# Patient Record
Sex: Male | Born: 1960 | Race: Black or African American | Hispanic: No | Marital: Single | Smoking: Never smoker
Health system: Southern US, Community
[De-identification: ages and names within clinical notes are randomized; demographics above are authoritative.]

## PROBLEM LIST (undated history)

## (undated) DIAGNOSIS — I10 Essential (primary) hypertension: Secondary | ICD-10-CM

## (undated) DIAGNOSIS — Z789 Other specified health status: Secondary | ICD-10-CM

## (undated) DIAGNOSIS — E119 Type 2 diabetes mellitus without complications: Secondary | ICD-10-CM

## (undated) HISTORY — PX: KNEE ARTHROSCOPY: SHX127

---

## 2009-03-23 ENCOUNTER — Emergency Department (HOSPITAL_COMMUNITY): Admission: EM | Admit: 2009-03-23 | Discharge: 2009-03-23 | Payer: Self-pay | Admitting: Emergency Medicine

## 2009-06-27 ENCOUNTER — Emergency Department (HOSPITAL_COMMUNITY): Admission: EM | Admit: 2009-06-27 | Discharge: 2009-06-27 | Payer: Self-pay | Admitting: Family Medicine

## 2013-05-13 ENCOUNTER — Ambulatory Visit: Payer: BC Managed Care – PPO | Admitting: Physical Therapy

## 2013-05-14 ENCOUNTER — Ambulatory Visit: Payer: BC Managed Care – PPO | Attending: Internal Medicine | Admitting: Physical Therapy

## 2013-05-14 DIAGNOSIS — R609 Edema, unspecified: Secondary | ICD-10-CM | POA: Diagnosis not present

## 2013-05-14 DIAGNOSIS — IMO0001 Reserved for inherently not codable concepts without codable children: Secondary | ICD-10-CM | POA: Diagnosis not present

## 2013-05-14 DIAGNOSIS — M6281 Muscle weakness (generalized): Secondary | ICD-10-CM | POA: Insufficient documentation

## 2013-05-14 DIAGNOSIS — M2550 Pain in unspecified joint: Secondary | ICD-10-CM | POA: Diagnosis not present

## 2013-05-14 DIAGNOSIS — R293 Abnormal posture: Secondary | ICD-10-CM | POA: Diagnosis not present

## 2013-05-14 DIAGNOSIS — M25569 Pain in unspecified knee: Secondary | ICD-10-CM | POA: Insufficient documentation

## 2013-05-18 ENCOUNTER — Ambulatory Visit: Payer: BC Managed Care – PPO | Admitting: Physical Therapy

## 2013-05-18 DIAGNOSIS — IMO0001 Reserved for inherently not codable concepts without codable children: Secondary | ICD-10-CM | POA: Diagnosis not present

## 2013-05-25 ENCOUNTER — Ambulatory Visit: Payer: BC Managed Care – PPO | Attending: Internal Medicine | Admitting: Physical Therapy

## 2013-05-25 DIAGNOSIS — R609 Edema, unspecified: Secondary | ICD-10-CM | POA: Insufficient documentation

## 2013-05-25 DIAGNOSIS — M2550 Pain in unspecified joint: Secondary | ICD-10-CM | POA: Insufficient documentation

## 2013-05-25 DIAGNOSIS — M25569 Pain in unspecified knee: Secondary | ICD-10-CM | POA: Insufficient documentation

## 2013-05-25 DIAGNOSIS — R293 Abnormal posture: Secondary | ICD-10-CM | POA: Insufficient documentation

## 2013-05-25 DIAGNOSIS — M6281 Muscle weakness (generalized): Secondary | ICD-10-CM | POA: Insufficient documentation

## 2013-05-25 DIAGNOSIS — IMO0001 Reserved for inherently not codable concepts without codable children: Secondary | ICD-10-CM | POA: Insufficient documentation

## 2013-05-27 ENCOUNTER — Ambulatory Visit: Payer: BC Managed Care – PPO | Admitting: Physical Therapy

## 2013-06-01 ENCOUNTER — Ambulatory Visit: Payer: BC Managed Care – PPO | Admitting: Physical Therapy

## 2013-06-03 ENCOUNTER — Encounter: Payer: BC Managed Care – PPO | Admitting: Physical Therapy

## 2013-06-08 ENCOUNTER — Encounter: Payer: BC Managed Care – PPO | Admitting: Physical Therapy

## 2013-06-10 ENCOUNTER — Encounter: Payer: BC Managed Care – PPO | Admitting: Physical Therapy

## 2014-04-09 ENCOUNTER — Ambulatory Visit (HOSPITAL_COMMUNITY)
Admission: RE | Admit: 2014-04-09 | Discharge: 2014-04-09 | Disposition: A | Discharge status: Home / Self Care | Payer: 59 | Source: Ambulatory Visit | Attending: Specialist | Admitting: Specialist

## 2014-04-09 ENCOUNTER — Other Ambulatory Visit (HOSPITAL_COMMUNITY): Payer: Self-pay | Admitting: Specialist

## 2014-04-09 DIAGNOSIS — M79609 Pain in unspecified limb: Secondary | ICD-10-CM

## 2014-04-09 DIAGNOSIS — M7989 Other specified soft tissue disorders: Secondary | ICD-10-CM | POA: Insufficient documentation

## 2014-04-09 DIAGNOSIS — M79662 Pain in left lower leg: Secondary | ICD-10-CM | POA: Diagnosis present

## 2014-04-09 DIAGNOSIS — M25562 Pain in left knee: Secondary | ICD-10-CM

## 2014-04-09 NOTE — Progress Notes (Signed)
*  PRELIMINARY RESULTS* Vascular Ultrasound Left lower extremity venous duplex has been completed.  Preliminary findings: No evidence of DVT. There is an area of mixed echoes in the left proximal calf, suggestive of a muscle tear.  Called results to [place]. She will let Dr. Shelle IronBeane know. Patient will be contacted with further instructions.    Farrel DemarkJill Eunice, RDMS, RVT  04/09/2014, 6:38 PM

## 2014-06-14 ENCOUNTER — Ambulatory Visit: Payer: Self-pay | Admitting: Orthopedic Surgery

## 2014-06-24 ENCOUNTER — Encounter (HOSPITAL_COMMUNITY): Admission: RE | Payer: Self-pay | Source: Ambulatory Visit

## 2014-06-24 ENCOUNTER — Inpatient Hospital Stay (HOSPITAL_COMMUNITY): Admission: RE | Admit: 2014-06-24 | Payer: 59 | Source: Ambulatory Visit | Admitting: Specialist

## 2014-06-24 SURGERY — ARTHROPLASTY, KNEE, TOTAL
Anesthesia: General | Site: Knee | Laterality: Left

## 2015-03-14 ENCOUNTER — Ambulatory Visit: Payer: Self-pay | Admitting: Orthopedic Surgery

## 2015-03-29 ENCOUNTER — Ambulatory Visit: Payer: Self-pay | Admitting: Orthopedic Surgery

## 2015-03-29 NOTE — H&P (Signed)
Mcgwire Morrisette DOB: 1960/09/14 Single / Language: Other / Race: Native Hawaiian or Other Pacific Islander Male  H&P Date: 03/29/15  Chief Complaint: L knee pain  History of Present Illness The patient is a 55 year old male who comes in today for a preoperative History and Physical. The patient is scheduled for a left total knee arthroplasty to be performed by Dr. Javier Docker, MD at [facility] on 04/07/15. Kejuan reports ongoing pain L knee s/p arthroscopy with continued swelling, stiffness, and pain limiting activities and quality of life at this point. Symptoms are refractory to activity modifications, quad strengthening, bracing, medications, aspiration and injection, relative rest.  Dr. Shelle Iron and the patient mutually agreed to proceed with a Left total knee replacement. Risks and benefits of the procedure were discussed including stiffness, suboptimal range of motion, persistent pain, infection requiring removal of prosthesis and reinsertion, need for prophylactic antibiotics in the future, for example, dental procedures, possible need for manipulation, revision in the future and also anesthetic complications including DVT, PE, etc. We discussed the perioperative course, time in the hospital, postoperative recovery and the need for elevation to control swelling. We also discussed the predicted range of motion and the probability that squatting and kneeling would be unobtainable in the future. In addition, postoperative anticoagulation was discussed. We have obtained preoperative medical clearance as necessary- Dr. Mikeal Hawthorne (PCP). Provided illustrated handout and discussed it in detail. They will enroll in the total joint replacement educational forum at the hospital.  [facility] pre-op appt scheduled for 2/9  Problem List/Past Medical Hx S/P arthroscopy of knee (B14.782)  Primary osteoarthritis of left knee (M17.12)  Irritable bowel syndrome  Migraine Headache  Ulcer disease  High  blood pressure  Diabetes Mellitus, Type I  Hepatitis A   Allergies No Known Drug Allergies 12/22/2013  Family History No pertinent family history  First Degree Relatives  reported  Social History Tobacco use  Never smoker. 12/22/2013 Children  1 Current work status  working full time Not under pain contract  Number of flights of stairs before winded  4-5 Tobacco / smoke exposure  12/22/2013: yes outdoors only Exercise  Exercises never Never consumed alcohol  12/22/2013: Never consumed alcohol Living situation  live alone Marital status  single No history of drug/alcohol rehab   Medication History  Oxycodone-Acetaminophen (7.5-325MG  Tablet, Oral) Active. Atenolol (  Tablet, Oral) Active. GlipiZIDE (  Tablet, Oral) Active. Ibuprofen (  Tablet, 1 (one) Oral) Active. Metoclopramide HCl (  Tablet, Oral) Active. Zolpidem Tartrate (  Tablet, Oral) Active. MetFORMIN HCl (  Tablet, Oral) Active. Medications Reconciled  Past Surgical History  No pertinent past surgical history   Review of Systems General Not Present- Chills, Fatigue, Fever, Memory Loss, Night Sweats, Weight Gain and Weight Loss. Skin Not Present- Eczema, Hives, Itching, Lesions and Rash. HEENT Not Present- Dentures, Double Vision, Headache, Hearing Loss, Tinnitus and Visual Loss. Respiratory Not Present- Allergies, Chronic Cough, Coughing up blood, Shortness of breath at rest and Shortness of breath with exertion. Cardiovascular Not Present- Chest Pain, Difficulty Breathing Lying Down, Murmur, Palpitations, Racing/skipping heartbeats and Swelling. Gastrointestinal Not Present- Abdominal Pain, Bloody Stool, Constipation, Diarrhea, Difficulty Swallowing, Heartburn, Jaundice, Loss of appetitie, Nausea and Vomiting. Male Genitourinary Not Present- Blood in Urine, Discharge, Flank Pain, Incontinence, Painful Urination, Urgency, Urinary frequency, Urinary Retention, Urinating  at Night and Weak urinary stream. Musculoskeletal Present- Joint Pain, Joint Swelling and Morning Stiffness. Not Present- Back Pain, Muscle Pain, Muscle Weakness and Spasms. Neurological Not Present- Blackout spells, Difficulty with balance, Dizziness,  Paralysis, Tremor and Weakness. Psychiatric Not Present- Insomnia.  Physical Exam General Mental Status -Alert and cooperative. General Appearance-pleasant, Not in acute distress. Orientation-Oriented X3. Build & Nutrition-Well nourished and Well developed.  Head and Neck Head-normocephalic, atraumatic . Neck Global Assessment - supple, no bruit auscultated on the right, no bruit auscultated on the left.  Eye Pupil - Bilateral-Regular and Round. Motion - Bilateral-EOMI.  Chest and Lung Exam Auscultation Breath sounds - clear at anterior chest wall and clear at posterior chest wall. Adventitious sounds - No Adventitious sounds.  Cardiovascular Auscultation Rhythm - Regular rate and rhythm. Heart Sounds - S1 WNL and S2 WNL. Murmurs & Other Heart Sounds - Auscultation of the heart reveals - No Murmurs.  Abdomen Palpation/Percussion Tenderness - Abdomen is non-tender to palpation. Rigidity (guarding) - Abdomen is soft. Auscultation Auscultation of the abdomen reveals - Bowel sounds normal.  Male Genitourinary Not done, not pertinent to present illness  Musculoskeletal On exam, well nourished, well developed. He does have an antalgic gait. Left knee moderate effusion noted. Mild tenderness over the lateral joint line. No tenderness in medial joint line, patella, patellar tendon, quad tendon, fibular head, peroneal nerve, popliteal space. No sign of DVT. Range of motion approximately -5 to 110 degrees. He does have a slight valgus deformity. Mild patellofemoral pain with compression. No instability.  Assessment & Plan Primary osteoarthritis of left knee (M17.12)  Pt with end-stage knee DJD, bone-on-bone, refractory  to conservative tx, scheduled for total knee replacement by Dr. Shelle Iron. We again discussed the procedure itself as well as risks, complications and alternatives, including but not limited to DVT, PE, infx, bleeding, failure of procedure, need for secondary procedure including manipulation, nerve injury, ongoing pain/symptoms, anesthesia risk, even stroke or death. Also discussed typical post-op protocols, activity restrictions, need for PT, flexion/extension exercises, time out of work. Discussed need for DVT ppx post-op with Xarelto then ASA per protocol. Discussed dental ppx. Also discussed limitations post-operatively such as kneeling and squatting. All questions were answered. Patient desires to proceed with surgery as scheduled. Will hold supplements, ASA and NSAIDs accordingly, instruced to hold Ibuprofen. MRSA decolonization Rx given today. Will plan Kefzol pre-op unless he tests positive for MRSA or MSSA. Will remain NPO after MN night before surgery. Will present to [facility] for pre-op testing as scheduled 03/31/15. Plan Xarelto 2 weeks post-op for DVT ppx then ASA. Plan Percocet ES, Robaxin, Colace, Miralax. Plan rehab post-op prior to home with HHPT then outpatient PT. Will follow up 10-14 days post-op for staple removal and xrays.  Plan Left total knee arthroplasty  Signed electronically by Dorothy Spark, PA-C for Dr. Shelle Iron

## 2015-03-30 NOTE — Patient Instructions (Addendum)
Eric Collier  03/30/2015   Your procedure is scheduled on: 04-07-15  Report to [facility] Main  Entrance take [facility]  elevators to 3rd floor to  Short Stay Center at  0515 AM.  Call this number if you have problems the morning of surgery [phone]   Remember: ONLY 1 PERSON MAY GO WITH YOU TO SHORT STAY TO GET  READY MORNING OF YOUR SURGERY.  Do not eat food or drink liquids :After Midnight.     Take these medicines the morning of surgery with A SIP OF WATER: Atenolol. Do not take any oral Diabetic meds or insulin AM.. DO NOT TAKE ANY DIABETIC MEDICATIONS DAY OF YOUR SURGERY                               You may not have any metal on your body including hair pins and              piercings  Do not wear jewelry, make-up, lotions, powders or perfumes, deodorant             Do not wear nail polish.  Do not shave  48 hours prior to surgery.              Men may shave face and neck.   Do not bring valuables to the hospital. [facility] IS NOT             RESPONSIBLE   FOR VALUABLES.  Contacts, dentures or bridgework may not be worn into surgery.  Leave suitcase in the car. After surgery it may be brought to your room.     Patients discharged the day of surgery will not be allowed to drive home.  Name and phone number of your driver:Eric Collier, brother- [phone] cell  Special Instructions: N/A              Please read over the following fact sheets you were given: _____________________________________________________________________             [facility] - Preparing for Surgery Before surgery, you can play an important role.  Because skin is not sterile, your skin needs to be as free of germs as possible.  You can reduce the number of germs on your skin by washing with CHG (chlorahexidine gluconate) soap before surgery.  CHG is an antiseptic cleaner which kills germs and bonds with the skin to continue killing germs even after washing. Please DO NOT  use if you have an allergy to CHG or antibacterial soaps.  If your skin becomes reddened/irritated stop using the CHG and inform your nurse when you arrive at Short Stay. Do not shave (including legs and underarms) for at least 48 hours prior to the first CHG shower.  You may shave your face/neck. Please follow these instructions carefully:  1.  Shower with CHG Soap the night before surgery and the  morning of Surgery.  2.  If you choose to wash your hair, wash your hair first as usual with your  normal  shampoo.  3.  After you shampoo, rinse your hair and body thoroughly to remove the  shampoo.                           4.  Use CHG as you would any other liquid soap.  You can apply chg directly  to the skin and wash                       Gently with a scrungie or clean washcloth.  5.  Apply the CHG Soap to your body ONLY FROM THE NECK DOWN.   Do not use on face/ open                           Wound or open sores. Avoid contact with eyes, ears mouth and genitals (private parts).                       Wash face,  Genitals (private parts) with your normal soap.             6.  Wash thoroughly, paying special attention to the area where your surgery  will be performed.  7.  Thoroughly rinse your body with warm water from the neck down.  8.  DO NOT shower/wash with your normal soap after using and rinsing off  the CHG Soap.                9.  Pat yourself dry with a clean towel.            10.  Wear clean pajamas.            11.  Place clean sheets on your bed the night of your first shower and do not  sleep with pets. Day of Surgery : Do not apply any lotions/deodorants the morning of surgery.  Please wear clean clothes to the hospital/surgery center.  FAILURE TO FOLLOW THESE INSTRUCTIONS MAY RESULT IN THE CANCELLATION OF YOUR SURGERY PATIENT SIGNATURE_________________________________  NURSE  SIGNATURE__________________________________  ________________________________________________________________________   Adam Phenix  An incentive spirometer is a tool that can help keep your lungs clear and active. This tool measures how well you are filling your lungs with each breath. Taking long deep breaths may help reverse or decrease the chance of developing breathing (pulmonary) problems (especially infection) following:  A long period of time when you are unable to move or be active. BEFORE THE PROCEDURE   If the spirometer includes an indicator to show your best effort, your nurse or respiratory therapist will set it to a desired goal.  If possible, sit up straight or lean slightly forward. Try not to slouch.  Hold the incentive spirometer in an upright position. INSTRUCTIONS FOR USE  1. Sit on the edge of your bed if possible, or sit up as far as you can in bed or on a chair. 2. Hold the incentive spirometer in an upright position. 3. Breathe out normally. 4. Place the mouthpiece in your mouth and seal your lips tightly around it. 5. Breathe in slowly and as deeply as possible, raising the piston or the ball toward the top of the column. 6. Hold your breath for 3-5 seconds or for as long as possible. Allow the piston or ball to fall to the bottom of the column. 7. Remove the mouthpiece from your mouth and breathe out normally. 8. Rest for a few seconds and repeat Steps 1 through 7 at least 10 times every 1-2 hours when you are awake. Take your time and take a few normal breaths between deep breaths. 9. The spirometer may include an indicator to show your best effort. Use the indicator as a goal to work toward  during each repetition. 10. After each set of 10 deep breaths, practice coughing to be sure your lungs are clear. If you have an incision (the cut made at the time of surgery), support your incision when coughing by placing a pillow or rolled up towels firmly  against it. Once you are able to get out of bed, walk around indoors and cough well. You may stop using the incentive spirometer when instructed by your caregiver.  RISKS AND COMPLICATIONS  Take your time so you do not get dizzy or light-headed.  If you are in pain, you may need to take or ask for pain medication before doing incentive spirometry. It is harder to take a deep breath if you are having pain. AFTER USE  Rest and breathe slowly and easily.  It can be helpful to keep track of a log of your progress. Your caregiver can provide you with a simple table to help with this. If you are using the spirometer at home, follow these instructions: [place] IF:   You are having difficultly using the spirometer.  You have trouble using the spirometer as often as instructed.  Your pain medication is not giving enough relief while using the spirometer.  You develop fever of 100.5 F (38.1 C) or higher. SEEK IMMEDIATE MEDICAL CARE IF:   You cough up bloody sputum that had not been present before.  You develop fever of 102 F (38.9 C) or greater.  You develop worsening pain at or near the incision site. MAKE SURE YOU:   Understand these instructions.  Will watch your condition.  Will get help right away if you are not doing well or get worse. Document Released: 06/18/2006 Document Revised: 04/30/2011 Document Reviewed: 08/19/2006 [facility] Patient Information 2014 [place], [place].   ________________________________________________________________________

## 2015-03-31 ENCOUNTER — Encounter (HOSPITAL_COMMUNITY)
Admission: RE | Admit: 2015-03-31 | Discharge: 2015-03-31 | Disposition: A | Discharge status: Home / Self Care | Payer: Managed Care, Other (non HMO) | Source: Ambulatory Visit | Attending: Specialist | Admitting: Specialist

## 2015-03-31 ENCOUNTER — Ambulatory Visit (HOSPITAL_COMMUNITY)
Admission: RE | Admit: 2015-03-31 | Discharge: 2015-03-31 | Disposition: A | Discharge status: Home / Self Care | Payer: Managed Care, Other (non HMO) | Source: Ambulatory Visit | Attending: Orthopedic Surgery | Admitting: Orthopedic Surgery

## 2015-03-31 ENCOUNTER — Encounter (HOSPITAL_COMMUNITY): Payer: Self-pay

## 2015-03-31 DIAGNOSIS — I10 Essential (primary) hypertension: Secondary | ICD-10-CM | POA: Diagnosis not present

## 2015-03-31 DIAGNOSIS — Z01818 Encounter for other preprocedural examination: Secondary | ICD-10-CM | POA: Insufficient documentation

## 2015-03-31 DIAGNOSIS — E119 Type 2 diabetes mellitus without complications: Secondary | ICD-10-CM | POA: Diagnosis not present

## 2015-03-31 DIAGNOSIS — M1712 Unilateral primary osteoarthritis, left knee: Secondary | ICD-10-CM

## 2015-03-31 HISTORY — DX: Other specified health status: Z78.9

## 2015-03-31 HISTORY — DX: Essential (primary) hypertension: I10

## 2015-03-31 HISTORY — DX: Type 2 diabetes mellitus without complications: E11.9

## 2015-03-31 LAB — SURGICAL PCR SCREEN
MRSA, PCR: NEGATIVE
STAPHYLOCOCCUS AUREUS: NEGATIVE

## 2015-03-31 LAB — BASIC METABOLIC PANEL
ANION GAP: 9 (ref 5–15)
BUN: 17 mg/dL (ref 6–20)
CALCIUM: 9.4 mg/dL (ref 8.9–10.3)
CO2: 27 mmol/L (ref 22–32)
Chloride: 102 mmol/L (ref 101–111)
Creatinine, Ser: 1.07 mg/dL (ref 0.61–1.24)
GFR calc Af Amer: >60 mL/min (ref >60)
GLUCOSE: 150 mg/dL — AB (ref 65–99)
POTASSIUM: 4.9 mmol/L (ref 3.5–5.1)
SODIUM: 138 mmol/L (ref 135–145)

## 2015-03-31 LAB — PROTIME-INR
INR: 1.1 (ref 0.00–1.49)
PROTHROMBIN TIME: 14 s (ref 11.6–15.2)

## 2015-03-31 LAB — URINALYSIS, ROUTINE W REFLEX MICROSCOPIC
Bilirubin Urine: NEGATIVE
GLUCOSE, UA: NEGATIVE mg/dL
HGB URINE DIPSTICK: NEGATIVE
Ketones, ur: NEGATIVE mg/dL
LEUKOCYTES UA: NEGATIVE
Nitrite: NEGATIVE
PH: 5.5 (ref 5.0–8.0)
PROTEIN: NEGATIVE mg/dL
SPECIFIC GRAVITY, URINE: 1.023 (ref 1.005–1.030)

## 2015-03-31 LAB — CBC
HEMATOCRIT: 43.7 % (ref 39.0–52.0)
Hemoglobin: 13.8 g/dL (ref 13.0–17.0)
MCH: 27.5 pg (ref 26.0–34.0)
MCHC: 31.6 g/dL (ref 30.0–36.0)
MCV: 87.1 fL (ref 78.0–100.0)
PLATELETS: 232 10*3/uL (ref 150–400)
RBC: 5.02 MIL/uL (ref 4.22–5.81)
RDW: 13.6 % (ref 11.5–15.5)
WBC: 4.4 10*3/uL (ref 4.0–10.5)

## 2015-03-31 LAB — APTT: APTT: 31 s (ref 24–37)

## 2015-03-31 LAB — ABO/RH: ABO/RH(D): O POS

## 2015-03-31 NOTE — Pre-Procedure Instructions (Addendum)
EKG done today. Pt states Dr. Shelle Iron office gave him ointment and soap wash to use now throughout until surgery.

## 2015-04-01 LAB — HEMOGLOBIN A1C
HEMOGLOBIN A1C: 7.4 % — AB (ref 4.8–5.6)
MEAN PLASMA GLUCOSE: 166 mg/dL

## 2015-04-07 ENCOUNTER — Encounter (HOSPITAL_COMMUNITY): Payer: Self-pay | Admitting: *Deleted

## 2015-04-07 ENCOUNTER — Encounter (HOSPITAL_COMMUNITY)
Admission: RE | Disposition: A | Discharge status: Skilled Nursing Facility | Payer: Self-pay | Source: Ambulatory Visit | Attending: Specialist

## 2015-04-07 ENCOUNTER — Inpatient Hospital Stay (HOSPITAL_COMMUNITY)
Admission: RE | Admit: 2015-04-07 | Discharge: 2015-04-09 | DRG: 470 | Disposition: A | Discharge status: Skilled Nursing Facility | Payer: 59 | Source: Ambulatory Visit | Attending: Specialist | Admitting: Specialist

## 2015-04-07 ENCOUNTER — Inpatient Hospital Stay (HOSPITAL_COMMUNITY): Payer: 59

## 2015-04-07 ENCOUNTER — Inpatient Hospital Stay (HOSPITAL_COMMUNITY): Payer: 59 | Admitting: Anesthesiology

## 2015-04-07 DIAGNOSIS — J9811 Atelectasis: Secondary | ICD-10-CM | POA: Diagnosis not present

## 2015-04-07 DIAGNOSIS — E119 Type 2 diabetes mellitus without complications: Secondary | ICD-10-CM | POA: Diagnosis present

## 2015-04-07 DIAGNOSIS — M25562 Pain in left knee: Secondary | ICD-10-CM | POA: Diagnosis present

## 2015-04-07 DIAGNOSIS — Z7984 Long term (current) use of oral hypoglycemic drugs: Secondary | ICD-10-CM | POA: Diagnosis not present

## 2015-04-07 DIAGNOSIS — I10 Essential (primary) hypertension: Secondary | ICD-10-CM | POA: Diagnosis present

## 2015-04-07 DIAGNOSIS — Z96652 Presence of left artificial knee joint: Secondary | ICD-10-CM

## 2015-04-07 DIAGNOSIS — Z79899 Other long term (current) drug therapy: Secondary | ICD-10-CM

## 2015-04-07 DIAGNOSIS — M1712 Unilateral primary osteoarthritis, left knee: Secondary | ICD-10-CM | POA: Diagnosis present

## 2015-04-07 DIAGNOSIS — Z96659 Presence of unspecified artificial knee joint: Secondary | ICD-10-CM

## 2015-04-07 HISTORY — PX: TOTAL KNEE ARTHROPLASTY: SHX125

## 2015-04-07 LAB — GLUCOSE, CAPILLARY
GLUCOSE-CAPILLARY: 195 mg/dL — AB (ref 65–99)
GLUCOSE-CAPILLARY: 206 mg/dL — AB (ref 65–99)
Glucose-Capillary: 136 mg/dL — ABNORMAL HIGH (ref 65–99)
Glucose-Capillary: 163 mg/dL — ABNORMAL HIGH (ref 65–99)
Glucose-Capillary: 145 mg/dL — ABNORMAL HIGH (ref 65–99)

## 2015-04-07 LAB — TYPE AND SCREEN
ABO/RH(D): O POS
Antibody Screen: NEGATIVE

## 2015-04-07 SURGERY — ARTHROPLASTY, KNEE, TOTAL
Anesthesia: General | Site: Knee | Laterality: Left

## 2015-04-07 MED ORDER — CEFAZOLIN SODIUM-DEXTROSE 2-3 GM-% IV SOLR
INTRAVENOUS | Status: AC
  Filled 2015-04-07: qty 50

## 2015-04-07 MED ORDER — FENTANYL CITRATE (PF) 250 MCG/5ML IJ SOLN
INTRAMUSCULAR | Status: AC
  Filled 2015-04-07: qty 5

## 2015-04-07 MED ORDER — ONDANSETRON HCL 4 MG/2ML IJ SOLN
INTRAMUSCULAR | Status: DC | PRN
  Administered 2015-04-07: 4 mg via INTRAVENOUS

## 2015-04-07 MED ORDER — FENTANYL CITRATE (PF) 100 MCG/2ML IJ SOLN
INTRAMUSCULAR | Status: DC | PRN
  Administered 2015-04-07 (×2): 100 ug via INTRAVENOUS
  Administered 2015-04-07: 50 ug via INTRAVENOUS
  Administered 2015-04-07 (×2): 100 ug via INTRAVENOUS

## 2015-04-07 MED ORDER — SENNOSIDES-DOCUSATE SODIUM 8.6-50 MG PO TABS: 1.0000 | ORAL_TABLET | Freq: Every evening | ORAL | Status: DC | PRN

## 2015-04-07 MED ORDER — LABETALOL HCL 5 MG/ML IV SOLN
INTRAVENOUS | Status: DC | PRN
  Administered 2015-04-07 (×4): 5 mg via INTRAVENOUS

## 2015-04-07 MED ORDER — PROPOFOL 10 MG/ML IV BOLUS
INTRAVENOUS | Status: DC | PRN
  Administered 2015-04-07: 200 mg via INTRAVENOUS

## 2015-04-07 MED ORDER — ACETAMINOPHEN 325 MG PO TABS
650.0000 mg | ORAL_TABLET | Freq: Four times a day (QID) | ORAL | Status: DC | PRN
  Administered 2015-04-08 – 2015-04-09 (×3): 650 mg via ORAL
  Filled 2015-04-07 (×3): qty 2

## 2015-04-07 MED ORDER — BISACODYL 5 MG PO TBEC: 5.0000 mg | DELAYED_RELEASE_TABLET | Freq: Every day | ORAL | Status: DC | PRN

## 2015-04-07 MED ORDER — DIPHENHYDRAMINE HCL 12.5 MG/5ML PO ELIX: 12.5000 mg | ORAL_SOLUTION | ORAL | Status: DC | PRN

## 2015-04-07 MED ORDER — HYDROMORPHONE HCL 2 MG/ML IJ SOLN
INTRAMUSCULAR | Status: AC
  Filled 2015-04-07: qty 1

## 2015-04-07 MED ORDER — METOCLOPRAMIDE HCL 5 MG/ML IJ SOLN: 5.0000 mg | Freq: Three times a day (TID) | INTRAMUSCULAR | Status: DC | PRN

## 2015-04-07 MED ORDER — HYDROMORPHONE HCL 1 MG/ML IJ SOLN
INTRAMUSCULAR | Status: AC
  Filled 2015-04-07: qty 1

## 2015-04-07 MED ORDER — POLYETHYLENE GLYCOL 3350 17 G PO PACK: 17.0000 g | PACK | Freq: Every day | ORAL | Status: AC

## 2015-04-07 MED ORDER — POLYMYXIN B SULFATE 500000 UNITS IJ SOLR
INTRAMUSCULAR | Status: DC | PRN
  Administered 2015-04-07: 500 mL

## 2015-04-07 MED ORDER — ACETAMINOPHEN 650 MG RE SUPP: 650.0000 mg | Freq: Four times a day (QID) | RECTAL | Status: DC | PRN

## 2015-04-07 MED ORDER — ROCURONIUM BROMIDE 100 MG/10ML IV SOLN
INTRAVENOUS | Status: AC
  Filled 2015-04-07: qty 1

## 2015-04-07 MED ORDER — TRANEXAMIC ACID 1000 MG/10ML IV SOLN
1000.0000 mg | INTRAVENOUS | Status: AC
  Administered 2015-04-07: 1000 mg via INTRAVENOUS
  Filled 2015-04-07: qty 10

## 2015-04-07 MED ORDER — DEXAMETHASONE SODIUM PHOSPHATE 10 MG/ML IJ SOLN
INTRAMUSCULAR | Status: AC
  Filled 2015-04-07: qty 1

## 2015-04-07 MED ORDER — MIDAZOLAM HCL 5 MG/5ML IJ SOLN
INTRAMUSCULAR | Status: DC | PRN
  Administered 2015-04-07: 2 mg via INTRAVENOUS

## 2015-04-07 MED ORDER — MAGNESIUM CITRATE PO SOLN: 1.0000 | Freq: Once | ORAL | Status: DC | PRN

## 2015-04-07 MED ORDER — FENTANYL CITRATE (PF) 100 MCG/2ML IJ SOLN
INTRAMUSCULAR | Status: AC
  Filled 2015-04-07: qty 2

## 2015-04-07 MED ORDER — ONDANSETRON HCL 4 MG PO TABS
4.0000 mg | ORAL_TABLET | Freq: Four times a day (QID) | ORAL | Status: DC | PRN
  Administered 2015-04-08: 4 mg via ORAL
  Filled 2015-04-07: qty 1

## 2015-04-07 MED ORDER — SUGAMMADEX SODIUM 200 MG/2ML IV SOLN
INTRAVENOUS | Status: DC | PRN
  Administered 2015-04-07: 200 mg via INTRAVENOUS

## 2015-04-07 MED ORDER — HYDROMORPHONE HCL 1 MG/ML IJ SOLN
1.0000 mg | INTRAMUSCULAR | Status: DC | PRN
  Administered 2015-04-08 (×4): 1 mg via INTRAVENOUS
  Filled 2015-04-07 (×4): qty 1

## 2015-04-07 MED ORDER — DOCUSATE SODIUM 100 MG PO CAPS
100.0000 mg | ORAL_CAPSULE | Freq: Two times a day (BID) | ORAL | Status: DC
  Administered 2015-04-07 – 2015-04-09 (×4): 100 mg via ORAL

## 2015-04-07 MED ORDER — HYDROMORPHONE HCL 1 MG/ML IJ SOLN
INTRAMUSCULAR | Status: DC | PRN
  Administered 2015-04-07 (×2): 1 mg via INTRAVENOUS

## 2015-04-07 MED ORDER — ATENOLOL 25 MG PO TABS
25.0000 mg | ORAL_TABLET | Freq: Every day | ORAL | Status: DC
  Administered 2015-04-07 – 2015-04-09 (×3): 25 mg via ORAL
  Filled 2015-04-07 (×3): qty 1

## 2015-04-07 MED ORDER — RIVAROXABAN 10 MG PO TABS: 10.0000 mg | ORAL_TABLET | Freq: Every day | ORAL | Status: DC

## 2015-04-07 MED ORDER — SUGAMMADEX SODIUM 200 MG/2ML IV SOLN
INTRAVENOUS | Status: AC
  Filled 2015-04-07: qty 2

## 2015-04-07 MED ORDER — SODIUM CHLORIDE 0.9 % IR SOLN
Status: DC | PRN
  Administered 2015-04-07 (×2): 1000 mL

## 2015-04-07 MED ORDER — CEFAZOLIN SODIUM-DEXTROSE 2-3 GM-% IV SOLR
2.0000 g | Freq: Four times a day (QID) | INTRAVENOUS | Status: AC
  Administered 2015-04-07 – 2015-04-08 (×3): 2 g via INTRAVENOUS
  Filled 2015-04-07 (×3): qty 50

## 2015-04-07 MED ORDER — PROPOFOL 10 MG/ML IV BOLUS
INTRAVENOUS | Status: AC
  Filled 2015-04-07: qty 20

## 2015-04-07 MED ORDER — PHENOL 1.4 % MT LIQD
1.0000 | OROMUCOSAL | Status: DC | PRN
  Filled 2015-04-07: qty 177

## 2015-04-07 MED ORDER — LIDOCAINE HCL (CARDIAC) 20 MG/ML IV SOLN
INTRAVENOUS | Status: AC
  Filled 2015-04-07: qty 5

## 2015-04-07 MED ORDER — HYDRALAZINE HCL 20 MG/ML IJ SOLN
INTRAMUSCULAR | Status: DC | PRN
  Administered 2015-04-07: 5 mg via INTRAVENOUS

## 2015-04-07 MED ORDER — METOCLOPRAMIDE HCL 10 MG PO TABS: 5.0000 mg | ORAL_TABLET | Freq: Three times a day (TID) | ORAL | Status: DC | PRN

## 2015-04-07 MED ORDER — INSULIN ASPART 100 UNIT/ML ~~LOC~~ SOLN
0.0000 [IU] | Freq: Three times a day (TID) | SUBCUTANEOUS | Status: DC
  Administered 2015-04-07: 5 [IU] via SUBCUTANEOUS
  Administered 2015-04-07: 3 [IU] via SUBCUTANEOUS
  Administered 2015-04-08: 2 [IU] via SUBCUTANEOUS
  Administered 2015-04-09: 8 [IU] via SUBCUTANEOUS
  Administered 2015-04-09: 5 [IU] via SUBCUTANEOUS

## 2015-04-07 MED ORDER — RISAQUAD PO CAPS
1.0000 | ORAL_CAPSULE | Freq: Every day | ORAL | Status: DC
  Administered 2015-04-07 – 2015-04-09 (×3): 1 via ORAL
  Filled 2015-04-07 (×3): qty 1

## 2015-04-07 MED ORDER — ONDANSETRON HCL 4 MG/2ML IJ SOLN
INTRAMUSCULAR | Status: AC
  Filled 2015-04-07: qty 2

## 2015-04-07 MED ORDER — MENTHOL 3 MG MT LOZG: 1.0000 | LOZENGE | OROMUCOSAL | Status: DC | PRN

## 2015-04-07 MED ORDER — BUPIVACAINE-EPINEPHRINE 0.25% -1:200000 IJ SOLN
INTRAMUSCULAR | Status: DC | PRN
  Administered 2015-04-07: 60 mL

## 2015-04-07 MED ORDER — ONDANSETRON HCL 4 MG/2ML IJ SOLN: 4.0000 mg | Freq: Four times a day (QID) | INTRAMUSCULAR | Status: DC | PRN

## 2015-04-07 MED ORDER — HYDROMORPHONE HCL 1 MG/ML IJ SOLN
0.2500 mg | INTRAMUSCULAR | Status: DC | PRN
  Administered 2015-04-07 (×3): 0.5 mg via INTRAVENOUS

## 2015-04-07 MED ORDER — DEXAMETHASONE SODIUM PHOSPHATE 10 MG/ML IJ SOLN
INTRAMUSCULAR | Status: DC | PRN
  Administered 2015-04-07: 10 mg via INTRAVENOUS

## 2015-04-07 MED ORDER — OXYCODONE HCL 5 MG PO TABS: 5.0000 mg | ORAL_TABLET | Freq: Once | ORAL | Status: DC | PRN

## 2015-04-07 MED ORDER — MIDAZOLAM HCL 2 MG/2ML IJ SOLN
INTRAMUSCULAR | Status: AC
  Filled 2015-04-07: qty 2

## 2015-04-07 MED ORDER — SODIUM CHLORIDE 0.9 % IR SOLN
Status: AC
  Filled 2015-04-07: qty 1

## 2015-04-07 MED ORDER — ROCURONIUM BROMIDE 100 MG/10ML IV SOLN
INTRAVENOUS | Status: DC | PRN
  Administered 2015-04-07 (×5): 10 mg via INTRAVENOUS
  Administered 2015-04-07: 40 mg via INTRAVENOUS

## 2015-04-07 MED ORDER — DEXTROSE 5 % IV SOLN
500.0000 mg | Freq: Four times a day (QID) | INTRAVENOUS | Status: DC | PRN
  Administered 2015-04-07: 500 mg via INTRAVENOUS
  Filled 2015-04-07 (×2): qty 5

## 2015-04-07 MED ORDER — CEFAZOLIN SODIUM-DEXTROSE 2-3 GM-% IV SOLR
2.0000 g | INTRAVENOUS | Status: AC
  Administered 2015-04-07: 2 g via INTRAVENOUS

## 2015-04-07 MED ORDER — SUCCINYLCHOLINE CHLORIDE 20 MG/ML IJ SOLN
INTRAMUSCULAR | Status: DC | PRN
  Administered 2015-04-07: 100 mg via INTRAVENOUS

## 2015-04-07 MED ORDER — HYDRALAZINE HCL 20 MG/ML IJ SOLN
INTRAMUSCULAR | Status: AC
  Filled 2015-04-07: qty 1

## 2015-04-07 MED ORDER — OXYCODONE HCL 5 MG/5ML PO SOLN
5.0000 mg | Freq: Once | ORAL | Status: DC | PRN
  Filled 2015-04-07: qty 5

## 2015-04-07 MED ORDER — ALUM & MAG HYDROXIDE-SIMETH 200-200-20 MG/5ML PO SUSP
30.0000 mL | ORAL | Status: DC | PRN
  Administered 2015-04-08: 30 mL via ORAL
  Filled 2015-04-07: qty 30

## 2015-04-07 MED ORDER — DOCUSATE SODIUM 100 MG PO CAPS: 100.0000 mg | ORAL_CAPSULE | Freq: Two times a day (BID) | ORAL | Status: AC | PRN

## 2015-04-07 MED ORDER — METHOCARBAMOL 500 MG PO TABS
500.0000 mg | ORAL_TABLET | Freq: Four times a day (QID) | ORAL | Status: DC | PRN
  Administered 2015-04-08 – 2015-04-09 (×3): 500 mg via ORAL
  Filled 2015-04-07 (×3): qty 1

## 2015-04-07 MED ORDER — OXYCODONE-ACETAMINOPHEN 5-325 MG PO TABS: 1.0000 | ORAL_TABLET | ORAL | Status: AC | PRN

## 2015-04-07 MED ORDER — OXYCODONE HCL 5 MG PO TABS
5.0000 mg | ORAL_TABLET | ORAL | Status: DC | PRN
  Administered 2015-04-07 (×2): 5 mg via ORAL
  Administered 2015-04-08: 10 mg via ORAL
  Administered 2015-04-08: 5 mg via ORAL
  Administered 2015-04-08 – 2015-04-09 (×5): 10 mg via ORAL
  Filled 2015-04-07 (×4): qty 1
  Filled 2015-04-07 (×6): qty 2

## 2015-04-07 MED ORDER — LABETALOL HCL 5 MG/ML IV SOLN
INTRAVENOUS | Status: AC
  Filled 2015-04-07: qty 4

## 2015-04-07 MED ORDER — LIDOCAINE HCL (CARDIAC) 20 MG/ML IV SOLN
INTRAVENOUS | Status: DC | PRN
  Administered 2015-04-07: 100 mg via INTRAVENOUS

## 2015-04-07 MED ORDER — RIVAROXABAN 10 MG PO TABS
10.0000 mg | ORAL_TABLET | Freq: Every day | ORAL | Status: DC
  Administered 2015-04-08: 10 mg via ORAL
  Filled 2015-04-07 (×2): qty 1

## 2015-04-07 MED ORDER — POTASSIUM CHLORIDE IN NACL 20-0.9 MEQ/L-% IV SOLN
INTRAVENOUS | Status: AC
  Administered 2015-04-07: 14:00:00 via INTRAVENOUS
  Filled 2015-04-07 (×2): qty 1000

## 2015-04-07 MED ORDER — BUPIVACAINE-EPINEPHRINE (PF) 0.25% -1:200000 IJ SOLN
INTRAMUSCULAR | Status: AC
  Filled 2015-04-07: qty 60

## 2015-04-07 MED ORDER — LACTATED RINGERS IV SOLN
INTRAVENOUS | Status: DC
  Administered 2015-04-07 (×3): via INTRAVENOUS

## 2015-04-07 MED ORDER — METHOCARBAMOL 500 MG PO TABS: 500.0000 mg | ORAL_TABLET | Freq: Three times a day (TID) | ORAL | Status: AC | PRN

## 2015-04-07 SURGICAL SUPPLY — 60 items
BAG ZIPLOCK 12X15 (MISCELLANEOUS) IMPLANT
BANDAGE ACE 4X5 VEL STRL LF (GAUZE/BANDAGES/DRESSINGS) ×3 IMPLANT
BANDAGE ACE 6X5 VEL STRL LF (GAUZE/BANDAGES/DRESSINGS) ×3 IMPLANT
BANDAGE ELASTIC 4 VELCRO ST LF (GAUZE/BANDAGES/DRESSINGS) ×3 IMPLANT
BANDAGE ELASTIC 6 VELCRO ST LF (GAUZE/BANDAGES/DRESSINGS) ×3 IMPLANT
BLADE SAG 18X100X1.27 (BLADE) ×3 IMPLANT
BLADE SAW SGTL 13.0X1.19X90.0M (BLADE) ×3 IMPLANT
CAPT KNEE TOTAL 3 ATTUNE ×3 IMPLANT
CEMENT HV SMART SET (Cement) ×6 IMPLANT
CLOSURE WOUND 1/2 X4 (GAUZE/BANDAGES/DRESSINGS)
CLOTH 2% CHLOROHEXIDINE 3PK (PERSONAL CARE ITEMS) ×3 IMPLANT
CUFF TOURN SGL QUICK 34 (TOURNIQUET CUFF) ×2
CUFF TRNQT CYL 34X4X40X1 (TOURNIQUET CUFF) ×1 IMPLANT
DECANTER SPIKE VIAL GLASS SM (MISCELLANEOUS) IMPLANT
DRAPE INCISE IOBAN 66X45 STRL (DRAPES) IMPLANT
DRAPE LG THREE QUARTER DISP (DRAPES) ×6 IMPLANT
DRAPE ORTHO SPLIT 77X108 STRL (DRAPES) ×4
DRAPE SURG ORHT 6 SPLT 77X108 (DRAPES) ×2 IMPLANT
DRAPE U-SHAPE 47X51 STRL (DRAPES) ×3 IMPLANT
DRSG AQUACEL AG ADV 3.5X10 (GAUZE/BANDAGES/DRESSINGS) ×3 IMPLANT
DRSG TEGADERM 4X4.75 (GAUZE/BANDAGES/DRESSINGS) IMPLANT
DURAPREP 26ML APPLICATOR (WOUND CARE) ×3 IMPLANT
ELECT REM PT RETURN 9FT ADLT (ELECTROSURGICAL) ×3
ELECTRODE REM PT RTRN 9FT ADLT (ELECTROSURGICAL) ×1 IMPLANT
EVACUATOR 1/8 PVC DRAIN (DRAIN) IMPLANT
GAUZE SPONGE 2X2 8PLY STRL LF (GAUZE/BANDAGES/DRESSINGS) IMPLANT
GLOVE BIOGEL PI IND STRL 7.0 (GLOVE) ×1 IMPLANT
GLOVE BIOGEL PI IND STRL 8 (GLOVE) ×1 IMPLANT
GLOVE BIOGEL PI INDICATOR 7.0 (GLOVE) ×2
GLOVE BIOGEL PI INDICATOR 8 (GLOVE) ×2
GLOVE SURG SS PI 7.0 STRL IVOR (GLOVE) ×3 IMPLANT
GLOVE SURG SS PI 7.5 STRL IVOR (GLOVE) IMPLANT
GLOVE SURG SS PI 8.0 STRL IVOR (GLOVE) ×3 IMPLANT
GOWN STRL REUS W/TWL XL LVL3 (GOWN DISPOSABLE) ×6 IMPLANT
HANDPIECE INTERPULSE COAX TIP (DISPOSABLE) ×2
IMMOBILIZER KNEE 20 (SOFTGOODS) ×6 IMPLANT
IMMOBILIZER KNEE 20 THIGH 36 (SOFTGOODS) ×1 IMPLANT
MANIFOLD NEPTUNE II (INSTRUMENTS) ×3 IMPLANT
NS IRRIG 1000ML POUR BTL (IV SOLUTION) IMPLANT
PACK TOTAL KNEE CUSTOM (KITS) ×3 IMPLANT
POSITIONER SURGICAL ARM (MISCELLANEOUS) ×3 IMPLANT
SET HNDPC FAN SPRY TIP SCT (DISPOSABLE) ×1 IMPLANT
SPONGE GAUZE 2X2 STER 10/PKG (GAUZE/BANDAGES/DRESSINGS)
SPONGE SURGIFOAM ABS GEL 100 (HEMOSTASIS) IMPLANT
STAPLER VISISTAT (STAPLE) IMPLANT
STRIP CLOSURE SKIN 1/2X4 (GAUZE/BANDAGES/DRESSINGS) IMPLANT
SUT BONE WAX W31G (SUTURE) ×3 IMPLANT
SUT MNCRL AB 4-0 PS2 18 (SUTURE) IMPLANT
SUT VIC AB 1 CT1 27 (SUTURE) ×4
SUT VIC AB 1 CT1 27XBRD ANTBC (SUTURE) ×2 IMPLANT
SUT VIC AB 2-0 CT1 27 (SUTURE) ×6
SUT VIC AB 2-0 CT1 TAPERPNT 27 (SUTURE) ×3 IMPLANT
SUT VLOC 180 0 24IN GS25 (SUTURE) ×3 IMPLANT
SYR 50ML LL SCALE MARK (SYRINGE) ×3 IMPLANT
TOWER CARTRIDGE SMART MIX (DISPOSABLE) ×3 IMPLANT
TRAY FOLEY W/METER SILVER 14FR (SET/KITS/TRAYS/PACK) IMPLANT
TRAY FOLEY W/METER SILVER 16FR (SET/KITS/TRAYS/PACK) ×3 IMPLANT
WATER STERILE IRR 1500ML POUR (IV SOLUTION) ×3 IMPLANT
WRAP KNEE MAXI GEL POST OP (GAUZE/BANDAGES/DRESSINGS) ×3 IMPLANT
YANKAUER SUCT BULB TIP 10FT TU (MISCELLANEOUS) ×3 IMPLANT

## 2015-04-07 NOTE — Interval H&P Note (Signed)
History and Physical Interval Note:  04/07/2015 7:16 AM  Eric Collier  has presented today for surgery, with the diagnosis of DJD LEFT KNEE  The various methods of treatment have been discussed with the patient and family. After consideration of risks, benefits and other options for treatment, the patient has consented to  Procedure(s): LEFT TOTAL KNEE ARTHROPLASTY (Left) as a surgical intervention .  The patient's history has been reviewed, patient examined, no change in status, stable for surgery.  I have reviewed the patient's chart and labs.  Questions were answered to the patient's satisfaction.     Peggye Poon C

## 2015-04-07 NOTE — Progress Notes (Signed)
X-ray results noted 

## 2015-04-07 NOTE — Discharge Instructions (Signed)

## 2015-04-07 NOTE — Evaluation (Signed)
Physical Therapy Evaluation Patient Details Name: Eric Collier MRN: [id number] DOB: Mar 27, 1960 Today's Date: 04/07/2015   History of Present Illness  L TKR  Clinical Impression  Pt s/p L TKR presents with decreased L LE strength/ROM and post op pain limiting functional mobility.  Pt would benefit from short term SNF level rehab to maximize IND and safety prior to return home with very limited assist.    Follow Up Recommendations SNF    Equipment Recommendations  Rolling walker with 5" wheels    Recommendations for Other Services OT consult     Precautions / Restrictions Precautions Precautions: Knee;Fall Precaution Comments: Reviewed knee precautions through interpreter Required Braces or Orthoses: Knee Immobilizer - Left Knee Immobilizer - Left: Discontinue once straight leg raise with < 10 degree lag Restrictions Weight Bearing Restrictions: No Other Position/Activity Restrictions: WBAT      Mobility  Bed Mobility Overal bed mobility: Needs Assistance Bed Mobility: Supine to Sit     Supine to sit: Min assist     General bed mobility comments: cues for sequence and use of R LE to self assist  Transfers Overall transfer level: Needs assistance Equipment used: Rolling walker (2 wheeled) Transfers: Sit to/from Stand Sit to Stand: Min assist         General transfer comment: cues for LE management and use of UEs to self assist  Ambulation/Gait Ambulation/Gait assistance: Min assist Ambulation Distance (Feet): 37 Feet Assistive device: Rolling walker (2 wheeled) Gait Pattern/deviations: Step-to pattern;Decreased step length - right;Decreased step length - left;Shuffle;Trunk flexed Gait velocity: decr Gait velocity interpretation: Below normal speed for age/gender General Gait Details: cues for sequence, posture and position from [employer]            Wheelchair Mobility    Modified Rankin (Stroke Patients Only)       Balance                                              Pertinent Vitals/Pain Pain Assessment: 0-10 Pain Score: 3  Pain Location: L knee Pain Descriptors / Indicators: Aching;Sore Pain Intervention(s): Limited activity within patient's tolerance;Monitored during session;Premedicated before session;Ice applied    Home Living Family/patient expects to be discharged to:: Skilled nursing facility Living Arrangements: Non-relatives/Friends               Additional Comments: LTD assist at home with "roommate".  One level home with 3 stairs and Bil Rails    Prior Function Level of Independence: Independent               Hand Dominance        Extremity/Trunk Assessment   Upper Extremity Assessment: Overall WFL for tasks assessed           Lower Extremity Assessment: LLE deficits/detail      Cervical / Trunk Assessment: Normal  Communication   Communication: Prefers language other than [place];Interpreter utilized  Cognition Arousal/Alertness: Awake/alert Behavior During Therapy: WFL for tasks assessed/performed Overall Cognitive Status: Within Functional Limits for tasks assessed                      General Comments      Exercises        Assessment/Plan    PT Assessment Patient needs continued PT services  PT Diagnosis Difficulty walking   PT Problem List Decreased strength;Decreased range  of motion;Decreased activity tolerance;Decreased mobility;Decreased knowledge of use of DME;Pain  PT Treatment Interventions DME instruction;Gait training;Stair training;Functional mobility training;Therapeutic activities;Therapeutic exercise;Patient/family education   PT Goals (Current goals can be found in the Care Plan section) Acute Rehab PT Goals Patient Stated Goal: Short term SNF level rehab and then home with limited assist PT Goal Formulation: With patient Time For Goal Achievement: 04/12/15 Potential to Achieve Goals: Good    Frequency 7X/week    Barriers to discharge Decreased caregiver support Lives with Roommate    Co-evaluation               End of Session Equipment Utilized During Treatment: Gait belt;Left knee immobilizer Activity Tolerance: Patient tolerated treatment well Patient left: in chair;with call bell/phone within reach;with family/visitor present Nurse Communication: Mobility status         Time: 4098-1191 PT Time Calculation (min) (ACUTE ONLY): 25 min   Charges:   PT Evaluation $PT Eval Low Complexity: 1 Procedure PT Treatments $Gait Training: 8-22 mins   PT G Codes:        Eric Collier 04/17/2015, 5:56 PM

## 2015-04-07 NOTE — Transfer of Care (Signed)
Immediate Anesthesia Transfer of Care Note  Patient: Eric Collier  Procedure(s) Performed: Procedure(s): LEFT TOTAL KNEE ARTHROPLASTY (Left)  Patient Location: PACU  Anesthesia Type:General  Level of Consciousness: awake, alert , oriented and patient cooperative  Airway & Oxygen Therapy: Patient Spontanous Breathing and Patient connected to face mask oxygen  Post-op Assessment: Report given to RN, Post -op Vital signs reviewed and stable and Patient moving all extremities X 4  Post vital signs: stable  Last Vitals:  Filed Vitals:   04/07/15 0543  BP: 128/82  Pulse: 58  Temp: 36.5 C  Resp: 18    Complications: No apparent anesthesia complications

## 2015-04-07 NOTE — Anesthesia Procedure Notes (Signed)
Procedure Name: Intubation Date/Time: 04/07/2015 7:22 AM Performed by: Doran Clay Pre-anesthesia Checklist: Patient identified, Timeout performed, Emergency Drugs available, Suction available and Patient being monitored Patient Re-evaluated:Patient Re-evaluated prior to inductionOxygen Delivery Method: Circle system utilized Preoxygenation: Pre-oxygenation with 100% oxygen Intubation Type: IV induction Laryngoscope Size: Mac and 4 Grade View: Grade I Tube type: Oral Tube size: 7.5 mm Number of attempts: 1 Airway Equipment and Method: Stylet Placement Confirmation: ETT inserted through vocal cords under direct vision,  breath sounds checked- equal and bilateral and positive ETCO2 Secured at: 22 cm Tube secured with: Tape Dental Injury: Teeth and Oropharynx as per pre-operative assessment

## 2015-04-07 NOTE — Progress Notes (Signed)
Portable AP and Lateral Left Knee X-rays done. 

## 2015-04-07 NOTE — Progress Notes (Signed)
Patient ID: Eric Collier, male   DOB: [birth year], 55 y.o.   MRN: [id number]  Spoke with Radiology on repeat Xray as first rotated. Though was hematoma. Examined patient moderate edema, Compartments soft, minimal pain. NVI. Felt change in xray related to rotation as exam not impressive. At 13:30.  Discussed with nurse to observe. Called to discuss with nurse 17:30. No significant change. Doing well. Minimal pain. Hold CPM. Continue to observe.

## 2015-04-07 NOTE — H&P (View-Only) (Signed)
Eric Collier DOB: [birth year] Single / Language: Other / Race: Native Hawaiian or Other Pacific Islander Male  H&P Date: 03/29/15  Chief Complaint: L knee pain  History of Present Illness The patient is a 55 year old male who comes in today for a preoperative History and Physical. The patient is scheduled for a left total knee arthroplasty to be performed by Dr. Javier Docker, MD at [facility] on 04/07/15. Aurel reports ongoing pain L knee s/p arthroscopy with continued swelling, stiffness, and pain limiting activities and quality of life at this point. Symptoms are refractory to activity modifications, quad strengthening, bracing, medications, aspiration and injection, relative rest.  Dr. Shelle Iron and the patient mutually agreed to proceed with a Left total knee replacement. Risks and benefits of the procedure were discussed including stiffness, suboptimal range of motion, persistent pain, infection requiring removal of prosthesis and reinsertion, need for prophylactic antibiotics in the future, for example, dental procedures, possible need for manipulation, revision in the future and also anesthetic complications including DVT, PE, etc. We discussed the perioperative course, time in the hospital, postoperative recovery and the need for elevation to control swelling. We also discussed the predicted range of motion and the probability that squatting and kneeling would be unobtainable in the future. In addition, postoperative anticoagulation was discussed. We have obtained preoperative medical clearance as necessary- Dr. Mikeal Hawthorne (PCP). Provided illustrated handout and discussed it in detail. They will enroll in the total joint replacement educational forum at the hospital.  [facility] pre-op appt scheduled for 2/9  Problem List/Past Medical Hx S/P arthroscopy of knee (Z61.096)  Primary osteoarthritis of left knee (M17.12)  Irritable bowel syndrome  Migraine Headache  Ulcer disease  High  blood pressure  Diabetes Mellitus, Type I  Hepatitis A   Allergies No Known Drug Allergies 12/22/2013  Family History No pertinent family history  First Degree Relatives  reported  Social History Tobacco use  Never smoker. 12/22/2013 Children  1 Current work status  working full time Not under pain contract  Number of flights of stairs before winded  4-5 Tobacco / smoke exposure  12/22/2013: yes outdoors only Exercise  Exercises never Never consumed alcohol  12/22/2013: Never consumed alcohol Living situation  live alone Marital status  single No history of drug/alcohol rehab   Medication History  Oxycodone-Acetaminophen (7.5-325MG  Tablet, Oral) Active. Atenolol (  Tablet, Oral) Active. GlipiZIDE (  Tablet, Oral) Active. Ibuprofen (  Tablet, 1 (one) Oral) Active. Metoclopramide HCl (  Tablet, Oral) Active. Zolpidem Tartrate (  Tablet, Oral) Active. MetFORMIN HCl (  Tablet, Oral) Active. Medications Reconciled  Past Surgical History  No pertinent past surgical history   Review of Systems General Not Present- Chills, Fatigue, Fever, Memory Loss, Night Sweats, Weight Gain and Weight Loss. Skin Not Present- Eczema, Hives, Itching, Lesions and Rash. HEENT Not Present- Dentures, Double Vision, Headache, Hearing Loss, Tinnitus and Visual Loss. Respiratory Not Present- Allergies, Chronic Cough, Coughing up blood, Shortness of breath at rest and Shortness of breath with exertion. Cardiovascular Not Present- Chest Pain, Difficulty Breathing Lying Down, Murmur, Palpitations, Racing/skipping heartbeats and Swelling. Gastrointestinal Not Present- Abdominal Pain, Bloody Stool, Constipation, Diarrhea, Difficulty Swallowing, Heartburn, Jaundice, Loss of appetitie, Nausea and Vomiting. Male Genitourinary Not Present- Blood in Urine, Discharge, Flank Pain, Incontinence, Painful Urination, Urgency, Urinary frequency, Urinary Retention, Urinating  at Night and Weak urinary stream. Musculoskeletal Present- Joint Pain, Joint Swelling and Morning Stiffness. Not Present- Back Pain, Muscle Pain, Muscle Weakness and Spasms. Neurological Not Present- Blackout spells, Difficulty with balance, Dizziness,  Paralysis, Tremor and Weakness. Psychiatric Not Present- Insomnia.  Physical Exam General Mental Status -Alert and cooperative. General Appearance-pleasant, Not in acute distress. Orientation-Oriented X3. Build & Nutrition-Well nourished and Well developed.  Head and Neck Head-normocephalic, atraumatic . Neck Global Assessment - supple, no bruit auscultated on the right, no bruit auscultated on the left.  Eye Pupil - Bilateral-Regular and Round. Motion - Bilateral-EOMI.  Chest and Lung Exam Auscultation Breath sounds - clear at anterior chest wall and clear at posterior chest wall. Adventitious sounds - No Adventitious sounds.  Cardiovascular Auscultation Rhythm - Regular rate and rhythm. Heart Sounds - S1 WNL and S2 WNL. Murmurs & Other Heart Sounds - Auscultation of the heart reveals - No Murmurs.  Abdomen Palpation/Percussion Tenderness - Abdomen is non-tender to palpation. Rigidity (guarding) - Abdomen is soft. Auscultation Auscultation of the abdomen reveals - Bowel sounds normal.  Male Genitourinary Not done, not pertinent to present illness  Musculoskeletal On exam, well nourished, well developed. He does have an antalgic gait. Left knee moderate effusion noted. Mild tenderness over the lateral joint line. No tenderness in medial joint line, patella, patellar tendon, quad tendon, fibular head, peroneal nerve, popliteal space. No sign of DVT. Range of motion approximately -5 to 110 degrees. He does have a slight valgus deformity. Mild patellofemoral pain with compression. No instability.  Assessment & Plan Primary osteoarthritis of left knee (M17.12)  Pt with end-stage knee DJD, bone-on-bone, refractory  to conservative tx, scheduled for total knee replacement by Dr. Shelle Iron. We again discussed the procedure itself as well as risks, complications and alternatives, including but not limited to DVT, PE, infx, bleeding, failure of procedure, need for secondary procedure including manipulation, nerve injury, ongoing pain/symptoms, anesthesia risk, even stroke or death. Also discussed typical post-op protocols, activity restrictions, need for PT, flexion/extension exercises, time out of work. Discussed need for DVT ppx post-op with Xarelto then ASA per protocol. Discussed dental ppx. Also discussed limitations post-operatively such as kneeling and squatting. All questions were answered. Patient desires to proceed with surgery as scheduled. Will hold supplements, ASA and NSAIDs accordingly, instruced to hold Ibuprofen. MRSA decolonization Rx given today. Will plan Kefzol pre-op unless he tests positive for MRSA or MSSA. Will remain NPO after MN night before surgery. Will present to [facility] for pre-op testing as scheduled 03/31/15. Plan Xarelto 2 weeks post-op for DVT ppx then ASA. Plan Percocet ES, Robaxin, Colace, Miralax. Plan rehab post-op prior to home with HHPT then outpatient PT. Will follow up 10-14 days post-op for staple removal and xrays.  Plan Left total knee arthroplasty  Signed electronically by Dorothy Spark, PA-C for Dr. Shelle Iron

## 2015-04-07 NOTE — Anesthesia Postprocedure Evaluation (Signed)
Anesthesia Post Note  Patient: Eric Collier  Procedure(s) Performed: Procedure(s) (LRB): LEFT TOTAL KNEE ARTHROPLASTY (Left)  Patient location during evaluation: PACU Anesthesia Type: General Level of consciousness: awake and alert and patient cooperative Pain management: pain level controlled Vital Signs Assessment: post-procedure vital signs reviewed and stable Respiratory status: spontaneous breathing and respiratory function stable Cardiovascular status: stable Anesthetic complications: no    Last Vitals:  Filed Vitals:   04/07/15 1145 04/07/15 1201  BP: 145/93 137/82  Pulse: 83 89  Temp: 36.8 C 37.1 C  Resp: 14 14    Last Pain:  Filed Vitals:   04/07/15 1207  PainSc: 5                  Sierra Spargo S

## 2015-04-07 NOTE — Progress Notes (Signed)
Repeat Portable  AP and Lateral Left Knee X-rays done.

## 2015-04-07 NOTE — Op Note (Signed)
Eric Collier, DEMMA NO.:  [id number]  MEDICAL RECORD NO.:  [id number]  LOCATION:  [facility]                         FACILITY:  [facility]  PHYSICIAN:  Jene Every, M.D.    DATE OF BIRTH:  Jul 02, 1960  DATE OF PROCEDURE:  04/07/2015 DATE OF DISCHARGE:                              OPERATIVE REPORT   PREOPERATIVE DIAGNOSIS:  Degenerative joint disease, left knee end- stage.  POSTOPERATIVE DIAGNOSIS:  Degenerative joint disease, left knee end- stage.  PROCEDURE PERFORMED:  Left total knee arthroplasty utilizing Attune rotating platform, 4 femur, 6 tibia, 8 mm insert, 35 patella.  ASSISTANT:  Bissell.  HISTORY:  This is a 55 year old, end-stage osteoarthrosis of the knee and slight valgus deformity, refractory conservative treatment indicated for replacement of degenerated joint and undergone arthroscopic debridement, multiple steroid injections, remained symptomatic.  Risks and benefits were discussed including bleeding, infection, damage to neurovascular structures, DVT, PE, anesthetic complications, suboptimal range of motion, etc.  He was counseled perioperatively on blood glucose control, minimizing his A1c.  TECHNIQUE:  The patient in supine position, after induction of adequate anesthesia 2 g Kefzol, left lower extremity was prepped and draped and exsanguinated in usual sterile fashion.  Thigh tourniquet inflated to 275 mmHg.  Midline incision was then made over the knee, centered on the patella.  Full-thickness flaps developed.  Median parapatellar arthrotomy performed.  Copious amount of clear synovial fluid was evacuated.  Knee was flexed.  Patella everted.  Tricompartmental osteoarthrosis was noted particularly of the posterolateral horn. Hypertrophic synovium was excised in the suprapatellar pouch medially. Remnants of the medial and lateral menisci were removed as well as the ACL.  Geniculates cauterized.  A step drill was utilized and entered  the femoral canal just above the attachment of the PCL.  We irrigated it. We 1st looked at a 3 degree and then a 5 degree intramedullary guide with 9 off the distal femur.  We performed our initial cut without taking bone off the lateral condyle.  We then dropped the femur to a 10 and then re-cut beginning into the subchondral bone on the lateral femoral condyle.  There was no deficiency in lateral femoral condyle. Following this, we then turned our attention towards the tibia.  The tibia was subluxed.  External alignment guide, 3 degree of slope.  We 1st looked at 2 and then 4 off the posterior lateral corner parallel to the shaft, bisecting the tibiotalar joint.  This was then pinned, we performed our cut.  Remnants of medial and lateral menisci were then removed.  We checked our extension gap which was stable and symmetric. We then turned our attention back towards the femur.  We used the external alignment guide, 3 degree right under the femoral condyles.  No deficiency in the femoral condyle was noted, this was then pinned and we then checked with our alignment guide, this was off the anterior cortex of the femur.  We then performed our anterior, posterior, and chamfer cuts with the soft tissues well protected.  We placed a trial cutting block on this and checked the flexion gap prior to performing our cuts, it was symmetric at an 8.  Then we performed our anterior, posterior,  and chamfer cuts.  Flexion extension gaps were then trialed and they were equivalent and stable as was the mid flexion.  We turned our attention then towards completing the tibia, it was actually sized to a 6, maximizing our coverage just the medial aspect of tibial tubercle. This was then pinned.  We drilled it centrally utilizing a punch guide. He had fairly eburnated bone off the lateral femoral condyle, lateral tibial plateau.  Care was taken to accommodate this.  Next, we placed in our trial tibia, turned  our attention back towards the femur bisecting the canal, placed our box cut jig and then performed our box cut, placed a 4 femur and with an 8 mm insert, we had full extension and full flexion, good stability, varus and valgus stressing at 0 and 30 degrees.  Excellent patellofemoral tracking.  Turned our attention towards the patella, this was trialed at a 38, then a 35, medializing our PEG holes, we drilled those, placed a trial patella. Excellent patellofemoral tracking and appropriate version was noted. All instrumentation was then removed.  We checked posteriorly, cauterized our geniculate.  Popliteus was intact as was the capsule.  No residual menisci or PCL.  Pulsatile lavage using joint and mixed cement on back table in appropriate fashion.  Knee was flexed.  Tibia was subluxed, thoroughly dried, injecting cement in tibial canal, digitally pressurizing it after we drilled some pilot holes in the lateral tibial plateau.  Impacted our 6 tray.  Redundant cement removed, impacted our femur.  Redundant cement removed.  Trial 80 insert, full extension throughout the curing of the cement, and cemented our patellar component with a clamp.  After full curing of the cement, meticulously removed all redundant cement.  Bone wax on the free cancellous surfaces, 0.25% Marcaine and epinephrine was injected in the periarticular tissues. Copiously irrigated with pulsatile lavage and antibiotic irrigation. Then used our 8 polyethylene insert, reduced it, had full extension, full flexion, good stability, varus and valgus stressing 0-30 degrees. Negative anterior drawer.  Excellent patellofemoral tracking.  I then released the tourniquet.  Minimal bleeding was noted, cauterized any areas appropriately and there was slight flexion, reapproximated the patellar tendon with #1 Vicryl interrupted figure-of-eight sutures and then a running V-Loc.  Following this, we had full closure and we had flexion to  gravity at 90 degrees and excellent patellofemoral tracking and stability.  Copiously irrigated the wound, subcu with 2-0 and skin with staples.  Again flexion to gravity.  Excellent patellofemoral tracking and good mid flexion stability.  Sterile dressing applied, placed in immobilizer, extubated without difficulty, and transported to the recovery room in satisfactory condition.  The patient tolerated the procedure well.  No complications.  Assistant, Andrez Grime, PA.  BLOOD LOSS:  Minimal.  TOURNIQUET TIME:  94 minutes.     Jene Every, M.D.     Eric Collier  D:  04/07/2015  T:  04/07/2015  Job:  [id number]

## 2015-04-07 NOTE — Progress Notes (Signed)
Dr. Shelle Iron to see repeat X-RAYS   -to tell floor when to place patient in CPM

## 2015-04-07 NOTE — Anesthesia Preprocedure Evaluation (Signed)
Anesthesia Evaluation  Patient identified by MRN, date of birth, ID band Patient awake    Reviewed: Allergy & Precautions, NPO status , Patient's Chart, lab work & pertinent test results  Airway Mallampati: II   Neck ROM: full    Dental   Pulmonary neg pulmonary ROS,    breath sounds clear to auscultation       Cardiovascular hypertension,  Rhythm:regular Rate:Normal     Neuro/Psych    GI/Hepatic   Endo/Other  diabetes, Type 2  Renal/GU      Musculoskeletal   Abdominal   Peds  Hematology   Anesthesia Other Findings   Reproductive/Obstetrics                             Anesthesia Physical Anesthesia Plan  ASA: II  Anesthesia Plan: General   Post-op Pain Management:    Induction: Intravenous  Airway Management Planned: Oral ETT  Additional Equipment:   Intra-op Plan:   Post-operative Plan: Extubation in OR  Informed Consent: I have reviewed the patients History and Physical, chart, labs and discussed the procedure including the risks, benefits and alternatives for the proposed anesthesia with the patient or authorized representative who has indicated his/her understanding and acceptance.     Plan Discussed with: CRNA, Anesthesiologist and Surgeon  Anesthesia Plan Comments:         Anesthesia Quick Evaluation

## 2015-04-07 NOTE — Progress Notes (Signed)
Utilization review completed.  

## 2015-04-07 NOTE — Brief Op Note (Signed)
04/07/2015  9:39 AM  PATIENT:  Silas Boyden  55 y.o. male  PRE-OPERATIVE DIAGNOSIS:  DJD LEFT KNEE  POST-OPERATIVE DIAGNOSIS:  DJD LEFT KNEE  PROCEDURE:  Procedure(s): LEFT TOTAL KNEE ARTHROPLASTY (Left)  SURGEON:  Surgeon(s) and Role:    * Jene Every, MD - Primary  PHYSICIAN ASSISTANT:   ASSISTANTS: Bissell   ANESTHESIA:   general  EBL:  Total I/O In: 2000 [I.V.:2000] Out: 1850 [Urine:1800; Blood:50]  BLOOD ADMINISTERED:none  DRAINS: none   LOCAL MEDICATIONS USED:  MARCAINE     SPECIMEN:  No Specimen  DISPOSITION OF SPECIMEN:  N/A  COUNTS:  YES  TOURNIQUET:   Total Tourniquet Time Documented: Thigh (Left) - 94 minutes Total: Thigh (Left) - 94 minutes   DICTATION: .Other Dictation: Dictation Number [id number]  PLAN OF CARE: Admit to inpatient   PATIENT DISPOSITION:  PACU - hemodynamically stable.   Delay start of Pharmacological VTE agent (>24hrs) due to surgical blood loss or risk of bleeding: no

## 2015-04-08 LAB — BASIC METABOLIC PANEL
Anion gap: 9 (ref 5–15)
BUN: 14 mg/dL (ref 6–20)
CHLORIDE: 102 mmol/L (ref 101–111)
CO2: 26 mmol/L (ref 22–32)
Calcium: 8.7 mg/dL — ABNORMAL LOW (ref 8.9–10.3)
Creatinine, Ser: 1.03 mg/dL (ref 0.61–1.24)
GFR calc Af Amer: >60 mL/min (ref >60)
GFR calc non Af Amer: >60 mL/min (ref >60)
Glucose, Bld: 141 mg/dL — ABNORMAL HIGH (ref 65–99)
Potassium: 3.9 mmol/L (ref 3.5–5.1)
SODIUM: 137 mmol/L (ref 135–145)

## 2015-04-08 LAB — GLUCOSE, CAPILLARY
GLUCOSE-CAPILLARY: 121 mg/dL — AB (ref 65–99)
GLUCOSE-CAPILLARY: 167 mg/dL — AB (ref 65–99)
GLUCOSE-CAPILLARY: 224 mg/dL — AB (ref 65–99)
Glucose-Capillary: 215 mg/dL — ABNORMAL HIGH (ref 65–99)

## 2015-04-08 LAB — CBC
HCT: 35.5 % — ABNORMAL LOW (ref 39.0–52.0)
HEMOGLOBIN: 11.8 g/dL — AB (ref 13.0–17.0)
MCH: 28.2 pg (ref 26.0–34.0)
MCHC: 33.2 g/dL (ref 30.0–36.0)
MCV: 84.7 fL (ref 78.0–100.0)
Platelets: 215 10*3/uL (ref 150–400)
RBC: 4.19 MIL/uL — ABNORMAL LOW (ref 4.22–5.81)
RDW: 13.4 % (ref 11.5–15.5)
WBC: 8.7 10*3/uL (ref 4.0–10.5)

## 2015-04-08 MED ORDER — ASPIRIN 325 MG PO TABS
325.0000 mg | ORAL_TABLET | Freq: Once | ORAL | Status: DC
  Filled 2015-04-08: qty 1

## 2015-04-08 MED ORDER — ASPIRIN 325 MG PO TABS: 325.0000 mg | ORAL_TABLET | Freq: Two times a day (BID) | ORAL | Status: AC

## 2015-04-08 MED ORDER — ASPIRIN 325 MG PO TABS: 325.0000 mg | ORAL_TABLET | Freq: Two times a day (BID) | ORAL | Status: DC

## 2015-04-08 NOTE — Progress Notes (Signed)
CSW assisting with d/c planning. [employer] insurance has authoried SNF placement at [facility] for d/c SAT. Pt is aware and in agreement with this plan. PT has approved transport by car. Pt's brother will provide transportation. Week end CSW will assist with d/c planning to SNF.  Cori Razor LCSW [phone]

## 2015-04-08 NOTE — Evaluation (Signed)
Occupational Therapy Evaluation Patient Details Name: Eric Collier MRN: [id number] DOB: 1960/10/05 Today's Date: 04/08/2015    History of Present Illness L TKR   Clinical Impression   This 55 year old man was admitted for the above surgery. He will will benefit from skilled OT to increase safety and independence with adls.  Goals in acute are for supervision level. Pt currently needs min A and has limited assistance at home.      Follow Up Recommendations  SNF;Supervision/Assistance - 24 hour    Equipment Recommendations  3 in 1 bedside comode    Recommendations for Other Services       Precautions / Restrictions Precautions Precautions: Knee;Fall Precaution Comments: Reviewed knee precautions through interpreter Required Braces or Orthoses: Knee Immobilizer - Left Knee Immobilizer - Left: Discontinue once straight leg raise with < 10 degree lag Restrictions Weight Bearing Restrictions: No Other Position/Activity Restrictions: WBAT      Mobility Bed Mobility               General bed mobility comments: oob  Transfers   Equipment used: Standard walker Transfers: Sit to/from Stand Sit to Stand: Min guard         General transfer comment: cues for UE/LE placement; multimodal cues utilized    Balance                                            ADL Overall ADL's : Needs assistance/impaired     Grooming: Oral care;Supervision/safety;Standing   Upper Body Bathing: Set up;Sitting   Lower Body Bathing: Minimal assistance;Sit to/from stand   Upper Body Dressing : Set up;Sitting   Lower Body Dressing: Minimal assistance;Sit to/from stand   Toilet Transfer: Min guard;Ambulation;BSC;RW   Toileting- Architect and Hygiene: Min guard;Sit to/from stand   Tub/ Shower Transfer: Walk-in shower;Min guard;Ambulation     General ADL Comments: performed ADL.  Pt able to don pants without AD. Did not educate on sock aide as ace  wrap still in place.  Practiced shower transfer and educated on 3:1 placement     Vision     Perception     Praxis      Pertinent Vitals/Pain Pain Score: 5  Pain Location: L knee Pain Descriptors / Indicators: Aching Pain Intervention(s): Limited activity within patient's tolerance;Monitored during session;Premedicated before session;Repositioned;Ice applied     Hand Dominance     Extremity/Trunk Assessment Upper Extremity Assessment Upper Extremity Assessment: Overall WFL for tasks assessed           Communication Communication Communication: Prefers language other than English (understood most; multimodal cues used)   Cognition Arousal/Alertness: Awake/alert Behavior During Therapy: WFL for tasks assessed/performed Overall Cognitive Status: Within Functional Limits for tasks assessed                     General Comments       Exercises       Shoulder Instructions      Home Living Family/patient expects to be discharged to:: Skilled nursing facility Living Arrangements: Non-relatives/Friends                                      Prior Functioning/Environment Level of Independence: Independent             OT  Diagnosis: Generalized weakness   OT Problem List: Decreased strength;Decreased activity tolerance;Decreased knowledge of use of DME or AE;Pain   OT Treatment/Interventions: Self-care/ADL training;DME and/or AE instruction;Patient/family education    OT Goals(Current goals can be found in the care plan section) Acute Rehab OT Goals Patient Stated Goal: Short term SNF level rehab and then home with limited assist OT Goal Formulation: With patient Time For Goal Achievement: 04/15/15 Potential to Achieve Goals: Good ADL Goals Pt Will Perform Lower Body Bathing: with supervision;with adaptive equipment;sit to/from stand Pt Will Perform Lower Body Dressing: with adaptive equipment;sit to/from stand;with supervision Pt Will  Transfer to Toilet: with supervision;ambulating;bedside commode Pt Will Perform Tub/Shower Transfer: Shower transfer;ambulating;3 in 1  OT Frequency: Min 2X/week   Barriers to D/C:            Co-evaluation              End of Session    Activity Tolerance: Patient tolerated treatment well Patient left: in chair;with call bell/phone within reach   Time: 0830-0857 OT Time Calculation (min): 27 min Charges:  OT General Charges $OT Visit: 1 Procedure OT Evaluation $OT Eval Low Complexity: 1 Procedure OT Treatments $Self Care/Home Management : 8-22 mins G-Codes:    Abriella Filkins 18-Apr-2015, 9:49 AM  Marica Otter, OTR/L [phone] 18-Apr-2015

## 2015-04-08 NOTE — NC FL2 (Signed)
[place] MEDICAID FL2 LEVEL OF CARE SCREENING TOOL     IDENTIFICATION  Patient Name: Eric Collier Birthdate: 1960-05-03 Sex: male Admission Date (Current Location): 04/07/2015  [facility] and [place] Number:  Producer, television/film/video and Address:         Provider Number: [id number]  Attending Physician Name and Address:  Jene Every, MD  Relative Name and Phone Number:       Current Level of Care: Hospital Recommended Level of Care: Skilled Nursing Facility Prior Approval Number:    Date Approved/Denied:   PASRR Number: [id number]  Discharge Plan: SNF    Current Diagnoses: Patient Active Problem List   Diagnosis Date Noted  . Left knee DJD 04/07/2015    Orientation RESPIRATION BLADDER Height & Weight     Self, Time, Situation, Place  Normal Continent Weight: 92.534 kg (204 lb) Height:  5' 9.5" (176.5 cm)  BEHAVIORAL SYMPTOMS/MOOD NEUROLOGICAL BOWEL NUTRITION STATUS  Other (Comment) (no behaviors)   Continent Diet  AMBULATORY STATUS COMMUNICATION OF NEEDS Skin   Limited Assist Verbally Surgical wounds                       Personal Care Assistance Level of Assistance  Bathing, Feeding, Dressing Bathing Assistance: Limited assistance Feeding assistance: Independent Dressing Assistance: Limited assistance     Functional Limitations Info  Sight, Hearing, Speech Sight Info: Adequate Hearing Info: Adequate Speech Info: Adequate    SPECIAL CARE FACTORS FREQUENCY  PT (By licensed PT), OT (By licensed OT)     PT Frequency: 5 x wk OT Frequency: 5 x wk            Contractures Contractures Info: Not present    Additional Factors Info  Code Status Code Status Info: Full             Current Medications (04/08/2015):  This is the current hospital active medication list Current Facility-Administered Medications  Medication Dose Route Frequency Provider Last Rate Last Dose  . 0.9 % NaCl with KCl 20 mEq/ L  infusion   Intravenous Continuous  Jene Every, MD 50 mL/hr at 04/07/15 1331    . acetaminophen (TYLENOL) tablet 650 mg  650 mg Oral Q6H PRN Jene Every, MD       Or  . acetaminophen (TYLENOL) suppository 650 mg  650 mg Rectal Q6H PRN Jene Every, MD      . acidophilus (RISAQUAD) capsule 1 capsule  1 capsule Oral Daily Jene Every, MD   1 capsule at 04/08/15 0930  . alum & mag hydroxide-simeth (MAALOX/MYLANTA) 200-200-20 MG/5ML suspension 30 mL  30 mL Oral Q4H PRN Jene Every, MD      . atenolol (TENORMIN) tablet 25 mg  25 mg Oral Daily Jene Every, MD   25 mg at 04/08/15 0930  . bisacodyl (DULCOLAX) EC tablet 5 mg  5 mg Oral Daily PRN Jene Every, MD      . diphenhydrAMINE (BENADRYL) 12.5 MG/5ML elixir 12.5-25 mg  12.5-25 mg Oral Q4H PRN Jene Every, MD      . docusate sodium (COLACE) capsule 100 mg  100 mg Oral BID Jene Every, MD   100 mg at 04/08/15 0930  . HYDROmorphone (DILAUDID) injection 1 mg  1 mg Intravenous Q2H PRN Jene Every, MD      . insulin aspart (novoLOG) injection 0-15 Units  0-15 Units Subcutaneous TID WC Jene Every, MD   2 Units at 04/08/15 [phone]  . magnesium citrate solution 1 Bottle  1  Bottle Oral Once PRN Jene Every, MD      . menthol-cetylpyridinium (CEPACOL) lozenge 3 mg  1 lozenge Oral PRN Jene Every, MD       Or  . phenol (CHLORASEPTIC) mouth spray 1 spray  1 spray Mouth/Throat PRN Jene Every, MD      . methocarbamol (ROBAXIN) tablet 500 mg  500 mg Oral Q6H PRN Jene Every, MD       Or  . methocarbamol (ROBAXIN) 500 mg in dextrose 5 % 50 mL IVPB  500 mg Intravenous Q6H PRN Jene Every, MD   500 mg at 04/07/15 1030  . metoCLOPramide (REGLAN) tablet 5-10 mg  5-10 mg Oral Q8H PRN Jene Every, MD       Or  . metoCLOPramide (REGLAN) injection 5-10 mg  5-10 mg Intravenous Q8H PRN Jene Every, MD      . ondansetron [facility]) tablet 4 mg  4 mg Oral Q6H PRN Jene Every, MD       Or  . ondansetron [facility]) injection 4 mg  4 mg Intravenous Q6H PRN Jene Every, MD       . oxyCODONE (Oxy IR/ROXICODONE) immediate release tablet 5-10 mg  5-10 mg Oral Q3H PRN Jene Every, MD   5 mg at 04/08/15 0743  . rivaroxaban (XARELTO) tablet 10 mg  10 mg Oral Q breakfast Jene Every, MD   10 mg at 04/08/15 0743  . senna-docusate (Senokot-S) tablet 1 tablet  1 tablet Oral QHS PRN Jene Every, MD         Discharge Medications: Please see discharge summary for a list of discharge medications.  Relevant Imaging Results:  Relevant Lab Results:   Additional Information  SS# [id number]    Wendell Nicoson, Dickey Gave, LCSW

## 2015-04-08 NOTE — Clinical Social Work Placement (Signed)
   CLINICAL SOCIAL WORK PLACEMENT  NOTE  Date:  04/08/2015  Patient Details  Name: Eric Collier MRN: [id number] Date of Birth: January 08, 1961  Clinical Social Work is seeking post-discharge placement for this patient at the Skilled  Nursing Facility level of care (*CSW will initial, date and re-position this form in  chart as items are completed):  Yes   Patient/family provided with [place] Clinical Social Work Department's list of facilities offering this level of care within the geographic area requested by the patient (or if unable, by the patient's family).  Yes   Patient/family informed of their freedom to choose among providers that offer the needed level of care, that participate in Medicare, Medicaid or managed care program needed by the patient, have an available bed and are willing to accept the patient.  Yes   Patient/family informed of [facility]'s ownership interest in [facility] and [facility], as well as of the fact that they are under no obligation to receive care at these facilities.  PASRR submitted to EDS on 04/08/15     PASRR number received on 04/08/15     Existing PASRR number confirmed on       FL2 transmitted to all facilities in geographic area requested by pt/family on 04/08/15     FL2 transmitted to all facilities within larger geographic area on       Patient informed that his/her managed care company has contracts with or will negotiate with certain facilities, including the following:        Yes   Patient/family informed of bed offers received.  Patient chooses bed at [facility]     Physician recommends and patient chooses bed at      Patient to be transferred to [facility] on  .  Patient to be transferred to facility by       Patient family notified on   of transfer.  Name of family member notified:        PHYSICIAN       Additional Comment:    _______________________________________________ Royetta Asal, [place]  [phone] 04/08/2015, 3:44 PM

## 2015-04-08 NOTE — Discharge Summary (Signed)
Physician Discharge Summary   Patient ID: Eric Collier MRN: [id number] DOB/AGE: December 16, 1960 55 y.o.  Admit date: 04/07/2015 Discharge date: anticipate 2/18 or 2/19  Primary Diagnosis: Left knee primary osteoarthritis  Admission Diagnoses:  Past Medical History  Diagnosis Date  . Hypertension   . Diabetes mellitus without complication ([place])     oral meds only  . Language barrier     Speaks "[place] language"- will need interpreter   Discharge Diagnoses:   Active Problems:   Left knee DJD  Estimated body mass index is 29.7 kg/(m^2) as calculated from the following:   Height as of this encounter: 5' 9.5" (1.765 m).   Weight as of this encounter: 92.534 kg (204 lb).  Procedure:  Procedure(s) (LRB): LEFT TOTAL KNEE ARTHROPLASTY (Left)   Consults: None  HPI: see H&P Laboratory Data: Admission on 04/07/2015  Component Date Value Ref Range Status  . Glucose-Capillary 04/07/2015 136* 65 - 99 mg/dL Final  . Comment 1 04/07/2015 Notify RN   Final  . Glucose-Capillary 04/07/2015 195* 65 - 99 mg/dL Final  . Comment 1 04/07/2015 Notify RN   Final  . Comment 2 04/07/2015 Document in Chart   Final  . Glucose-Capillary 04/07/2015 206* 65 - 99 mg/dL Final  . Glucose-Capillary 04/07/2015 163* 65 - 99 mg/dL Final  . WBC 04/08/2015 8.7  4.0 - 10.5 K/uL Final  . RBC 04/08/2015 4.19* 4.22 - 5.81 MIL/uL Final  . Hemoglobin 04/08/2015 11.8* 13.0 - 17.0 g/dL Final  . HCT 04/08/2015 35.5* 39.0 - 52.0 % Final  . MCV 04/08/2015 84.7  78.0 - 100.0 fL Final  . MCH 04/08/2015 28.2  26.0 - 34.0 pg Final  . MCHC 04/08/2015 33.2  30.0 - 36.0 g/dL Final  . RDW 04/08/2015 13.4  11.5 - 15.5 % Final  . Platelets 04/08/2015 215  150 - 400 K/uL Final  . Sodium 04/08/2015 137  135 - 145 mmol/L Final  . Potassium 04/08/2015 3.9  3.5 - 5.1 mmol/L Final  . Chloride 04/08/2015 102  101 - 111 mmol/L Final  . CO2 04/08/2015 26  22 - 32 mmol/L Final  . Glucose, Bld 04/08/2015 141* 65 - 99 mg/dL Final  . BUN  04/08/2015 14  6 - 20 mg/dL Final  . Creatinine, Ser 04/08/2015 1.03  0.61 - 1.24 mg/dL Final  . Calcium 04/08/2015 8.7* 8.9 - 10.3 mg/dL Final  . GFR calc non Af Amer 04/08/2015 >60  >60 mL/min Final  . GFR calc Af Amer 04/08/2015 >60  >60 mL/min Final   Comment: (NOTE) The eGFR has been calculated using the CKD EPI equation. This calculation has not been validated in all clinical situations. eGFR's persistently <60 mL/min signify possible Chronic Kidney Disease.   . Anion gap 04/08/2015 9  5 - 15 Final  . Glucose-Capillary 04/07/2015 145* 65 - 99 mg/dL Final  . Glucose-Capillary 04/08/2015 121* 65 - 99 mg/dL Final  . Comment 1 04/08/2015 Notify RN   Final  Hospital Outpatient Visit on 03/31/2015  Component Date Value Ref Range Status  . MRSA, PCR 03/31/2015 NEGATIVE  NEGATIVE Final  . Staphylococcus aureus 03/31/2015 NEGATIVE  NEGATIVE Final   Comment:        The Xpert SA Assay (FDA approved for NASAL specimens in patients over 60 years of age), is one component of a comprehensive surveillance program.  Test performance has been validated by [facility] for patients greater than or equal to 12 year old. It is not intended to diagnose infection nor  to guide or monitor treatment.   Marland Kitchen aPTT 03/31/2015 31  24 - 37 seconds Final  . Sodium 03/31/2015 138  135 - 145 mmol/L Final  . Potassium 03/31/2015 4.9  3.5 - 5.1 mmol/L Final  . Chloride 03/31/2015 102  101 - 111 mmol/L Final  . CO2 03/31/2015 27  22 - 32 mmol/L Final  . Glucose, Bld 03/31/2015 150* 65 - 99 mg/dL Final  . BUN 03/31/2015 17  6 - 20 mg/dL Final  . Creatinine, Ser 03/31/2015 1.07  0.61 - 1.24 mg/dL Final  . Calcium 03/31/2015 9.4  8.9 - 10.3 mg/dL Final  . GFR calc non Af Amer 03/31/2015 >60  >60 mL/min Final  . GFR calc Af Amer 03/31/2015 >60  >60 mL/min Final   Comment: (NOTE) The eGFR has been calculated using the CKD EPI equation. This calculation has not been validated in all clinical situations. eGFR's  persistently <60 mL/min signify possible Chronic Kidney Disease.   . Anion gap 03/31/2015 9  5 - 15 Final  . WBC 03/31/2015 4.4  4.0 - 10.5 K/uL Final  . RBC 03/31/2015 5.02  4.22 - 5.81 MIL/uL Final  . Hemoglobin 03/31/2015 13.8  13.0 - 17.0 g/dL Final  . HCT 03/31/2015 43.7  39.0 - 52.0 % Final  . MCV 03/31/2015 87.1  78.0 - 100.0 fL Final  . MCH 03/31/2015 27.5  26.0 - 34.0 pg Final  . MCHC 03/31/2015 31.6  30.0 - 36.0 g/dL Final  . RDW 03/31/2015 13.6  11.5 - 15.5 % Final  . Platelets 03/31/2015 232  150 - 400 K/uL Final  . Prothrombin Time 03/31/2015 14.0  11.6 - 15.2 seconds Final  . INR 03/31/2015 1.10  0.00 - 1.49 Final  . ABO/RH(D) 03/31/2015 O POS   Final  . Antibody Screen 03/31/2015 NEG   Final  . Sample Expiration 03/31/2015 04/10/2015   Final  . Extend sample reason 03/31/2015 NO TRANSFUSIONS OR PREGNANCY IN THE PAST 3 MONTHS   Final  . Color, Urine 03/31/2015 YELLOW  YELLOW Final  . APPearance 03/31/2015 CLEAR  CLEAR Final  . Specific Gravity, Urine 03/31/2015 1.023  1.005 - 1.030 Final  . pH 03/31/2015 5.5  5.0 - 8.0 Final  . Glucose, UA 03/31/2015 NEGATIVE  NEGATIVE mg/dL Final  . Hgb urine dipstick 03/31/2015 NEGATIVE  NEGATIVE Final  . Bilirubin Urine 03/31/2015 NEGATIVE  NEGATIVE Final  . Ketones, ur 03/31/2015 NEGATIVE  NEGATIVE mg/dL Final  . Protein, ur 03/31/2015 NEGATIVE  NEGATIVE mg/dL Final  . Nitrite 03/31/2015 NEGATIVE  NEGATIVE Final  . Leukocytes, UA 03/31/2015 NEGATIVE  NEGATIVE Final   MICROSCOPIC NOT DONE ON URINES WITH NEGATIVE PROTEIN, BLOOD, LEUKOCYTES, NITRITE, OR GLUCOSE <1000 mg/dL.  . Hgb A1c MFr Bld 03/31/2015 7.4* 4.8 - 5.6 % Final   Comment: (NOTE)         Pre-diabetes: 5.7 - 6.4         Diabetes: >6.4         Glycemic control for adults with diabetes: <7.0   . Mean Plasma Glucose 03/31/2015 166   Final   Comment: (NOTE) Performed At: [facility] [place], [place] [id number] Eric Collier FU:[id number]    . ABO/RH(D) 03/31/2015 O POS   Final     X-Rays:Dg Knee 1-2 Views Left  03/31/2015  CLINICAL DATA:  Preoperative examination prior to a total joint replacement. EXAM: LEFT KNEE - 1-2 VIEW COMPARISON:  None in PACs FINDINGS: There is marked narrowing  of the lateral joint compartment without a definite bone on bone appearance. The medial and patellofemoral compartments are preserved. A suprapatellar effusion is suspected. There is no acute fracture nor dislocation. IMPRESSION: Moderate to severe joint space loss laterally. There is a suprapatellar effusion. Electronically Signed   By: David  Martinique M.D.   On: 03/31/2015 10:41   Dg Knee Left Port  04/07/2015  ADDENDUM REPORT: 04/07/2015 12:23 ADDENDUM: Additional frontal and lateral view of the left knee performed at 11:31 a.m. Again noted left knee prosthesis with anatomic alignment. There is soft tissue swelling suprapatellar region. There is interval moderate joint effusion with air-fluid level. Anterior displacement of patella. Hemarthrosis cannot be excluded. These results were called by telephone at the time of interpretation on 04/07/2015 at 12:22 pm to Dr. Susa Day , who verbally acknowledged these results. Electronically Signed   By: Lahoma Crocker M.D.   On: 04/07/2015 12:23  04/07/2015  CLINICAL DATA:  Status post left total knee replacement EXAM: PORTABLE LEFT KNEE - 1-2 VIEW COMPARISON:  03/31/2015 FINDINGS: Two portable views of the left knee submitted. There is left knee prosthesis with anatomic alignment. Postsurgical changes are noted with midline anterior skin staples. Small amount of periarticular soft tissue air. IMPRESSION: Left knee prosthesis with anatomic alignment. Postsurgical changes are noted. Electronically Signed: By: Lahoma Crocker M.D. On: 04/07/2015 10:32    EKG: Orders placed or performed during the hospital encounter of 03/31/15  . EKG 12 lead  . EKG 12 lead     Hospital Course: Duayne Brideau is a 55 y.o. who was  admitted to [facility]. They were brought to the operating room on 04/07/2015 and underwent Procedure(s): LEFT TOTAL KNEE ARTHROPLASTY.  Patient tolerated the procedure well and was later transferred to the recovery room and then to the orthopaedic floor for postoperative care.  They were given PO and IV analgesics for pain control following their surgery.  They were given 24 hours of postoperative antibiotics of  Anti-infectives    Start     Dose/Rate Route Frequency Ordered Stop   04/07/15 1400  ceFAZolin (ANCEF) IVPB 2 g/50 mL premix     2 g 100 mL/hr over 30 Minutes Intravenous Every 6 hours 04/07/15 1157 04/08/15 0231   04/07/15 0800  polymyxin B 500,000 Units, bacitracin 50,000 Units in sodium chloride irrigation 0.9 % 500 mL irrigation  Status:  Discontinued       As needed 04/07/15 0801 04/07/15 0954   04/07/15 0630  ceFAZolin (ANCEF) IVPB 2 g/50 mL premix     2 g 100 mL/hr over 30 Minutes Intravenous On call to O.R. 04/07/15 1829 04/07/15 0725     and started on DVT prophylaxis in the form of Xarelto, TED hose and SCDs.   PT and OT were ordered for total joint protocol.  Discharge planning consulted to help with postop disposition and equipment needs.  Patient had a good night on the evening of surgery.  They started to get up OOB with therapy on day one.  Continued to work with therapy into day two. By day three, the patient had progressed with therapy and meeting their goals.  Incision was healing well.  Patient was seen in rounds and was ready for discharge.   Diet: Diabetic diet Activity:WBAT Follow-up:in 10-14 days Disposition - Skilled nursing facility Discharged Condition: good      Medication List    STOP taking these medications        naproxen 500 MG tablet  Commonly known as:  NAPROSYN      TAKE these medications        acetaminophen 500 MG tablet  Commonly known as:  TYLENOL  Take 500-1,000 mg by mouth every 6 (six) hours as needed for moderate pain.      atenolol 25 MG tablet  Commonly known as:  TENORMIN  Take 25 mg by mouth daily.     docusate sodium 100 MG capsule  Commonly known as:  COLACE  Take 1 capsule (100 mg total) by mouth 2 (two) times daily as needed for mild constipation.     metFORMIN 1000 MG tablet  Commonly known as:  GLUCOPHAGE  Take 1,000 mg by mouth 2 (two) times daily.     methocarbamol 500 MG tablet  Commonly known as:  ROBAXIN  Take 1 tablet (500 mg total) by mouth every 8 (eight) hours as needed for muscle spasms.     oxyCODONE-acetaminophen 5-325 MG tablet  Commonly known as:  PERCOCET  Take 1-2 tablets by mouth every 4 (four) hours as needed.     pioglitazone 30 MG tablet  Commonly known as:  ACTOS  Take 30 mg by mouth daily.     polyethylene glycol packet  Commonly known as:  MIRALAX / GLYCOLAX  Take 17 g by mouth daily.     rivaroxaban 10 MG Tabs tablet  Commonly known as:  XARELTO  Take 1 tablet (10 mg total) by mouth daily.           Follow-up Information    Follow up with BEANE,JEFFREY C, Collier In 2 weeks.   Specialty:  Orthopedic Surgery   Why:  For suture removal   Contact information:   [address] [place] [address] [phone]       Signed: Lacie Draft, PA-C Orthopaedic Surgery 04/08/2015, 8:10 AM

## 2015-04-08 NOTE — Progress Notes (Signed)
Physical Therapy Treatment Patient Details Name: Eric Collier MRN: [id number] DOB: May 25, 1960 Today's Date: 04/08/2015    History of Present Illness L TKR    PT Comments    Steady progress with mobility.  Reviewed need for knee extension while resting - visitor able to interpret.  Follow Up Recommendations  SNF     Equipment Recommendations  Rolling walker with 5" wheels    Recommendations for Other Services OT consult     Precautions / Restrictions Precautions Precautions: Knee;Fall Precaution Comments: Reviewed knee precautions through interpreter Required Braces or Orthoses: Knee Immobilizer - Left Knee Immobilizer - Left: Discontinue once straight leg raise with < 10 degree lag Restrictions Weight Bearing Restrictions: No Other Position/Activity Restrictions: WBAT    Mobility  Bed Mobility Overal bed mobility: Needs Assistance Bed Mobility: Supine to Sit     Supine to sit: Min assist     General bed mobility comments: min assist with L LE  Transfers Overall transfer level: Needs assistance Equipment used: Rolling walker (2 wheeled) Transfers: Sit to/from Stand Sit to Stand: Min guard         General transfer comment: cues for UE/LE placement; multimodal cues utilized  Ambulation/Gait Ambulation/Gait assistance: Min guard Ambulation Distance (Feet): 98 Feet Assistive device: Rolling walker (2 wheeled) Gait Pattern/deviations: Step-to pattern;Decreased step length - right;Decreased step length - left;Shuffle;Trunk flexed Gait velocity: decr   General Gait Details: min cues for sequence, posture and position from [employer]            Wheelchair Mobility    Modified Rankin (Stroke Patients Only)       Balance                                    Cognition Arousal/Alertness: Awake/alert Behavior During Therapy: WFL for tasks assessed/performed Overall Cognitive Status: Within Functional Limits for tasks assessed                       Exercises      General Comments        Pertinent Vitals/Pain Pain Assessment: Faces Faces Pain Scale: Hurts a little bit Pain Location: L knee Pain Descriptors / Indicators: Aching;Sore Pain Intervention(s): Limited activity within patient's tolerance;Monitored during session;Premedicated before session (Pt declined ice packs)    Home Living                      Prior Function            PT Goals (current goals can now be found in the care plan section) Acute Rehab PT Goals Patient Stated Goal: Short term SNF level rehab and then home with limited assist PT Goal Formulation: With patient Time For Goal Achievement: 04/12/15 Potential to Achieve Goals: Good Progress towards PT goals: Progressing toward goals    Frequency  7X/week    PT Plan Current plan remains appropriate    Co-evaluation             End of Session Equipment Utilized During Treatment: Gait belt;Left knee immobilizer Activity Tolerance: Patient tolerated treatment well Patient left: in chair;with call bell/phone within reach;with chair alarm set;with family/visitor present     Time: 7846-9629 PT Time Calculation (min) (ACUTE ONLY): 17 min  Charges:  $Gait Training: 8-22 mins  G Codes:      Eric Collier 05/02/15, 4:37 PM

## 2015-04-08 NOTE — Clinical Social Work Note (Signed)
Clinical Social Work Assessment  Patient Details  Name: Eric Collier MRN: [id number] Date of Birth: September 09, 1960  Date of referral:  04/08/15               Reason for consult:  Facility Placement, Discharge Planning                Permission sought to share information with:  Facility Art therapist granted to share information::  Yes, Verbal Permission Granted  Name::        Agency::     Relationship::     Contact Information:     Housing/Transportation Living arrangements for the past 2 months:  Apartment Source of Information:  Patient Patient Interpreter Needed:  Other (Comment Required) ([place]) Criminal Activity/Legal Involvement Pertinent to Current Situation/Hospitalization:  No - Comment as needed Significant Relationships:  Friend Lives with:  Roommate Do you feel safe going back to the place where you live?   (SNF recommended) Need for family participation in patient care:  No ([place])  Care giving concerns:  Pt's care cannot be managed at home following hospital d/c.   Social Worker assessment / plan:  Pt hospitalized on 04/07/15 for a pre planned left total knee arthroplasty. CSW met with pt / interpreter to assist with d/c planning. PT has recommended ST Rehab at d/c. Pt is in agreement with this plan. SNF search has been initiated and bed offers pending. Pt has [employer]. CSW has explained that insurance will need to provide authorization for  SNF placement. CSW will continue to follow to assist with d/c planning needs.  Employment status:  Therapist, music:  Managed Care PT Recommendations:  [place] / Referral to community resources:  [place]  Patient/Family's Response to care:  Pt feels ST Rehab is needed.  Patient/Family's Understanding of and Emotional Response to Diagnosis, Current Treatment, and Prognosis:  PA was in to see pt this. D/c is expected SAT. CSW will meet again  with pt / interpreter to review bed offers.  Emotional Assessment Appearance:  Appears stated age Attitude/Demeanor/Rapport:  Other (cooperative) Affect (typically observed):  Calm, Appropriate Orientation:  Oriented to Self, Oriented to Place, Oriented to  Time, Oriented to Situation Alcohol / Substance use:  Not Applicable Psych involvement (Current and /or in the community):  No (Comment)  Discharge Needs  Concerns to be addressed:  Discharge Planning Concerns, Other (Comment Required (Pt doesn't speak english.) Readmission within the last 30 days:  No Current discharge risk:  None Barriers to Discharge:  No Barriers Identified   Luretha Rued, [place] 04/08/2015, 9:55 AM

## 2015-04-08 NOTE — Progress Notes (Signed)
Physical Therapy Treatment Patient Details Name: Eric Collier MRN: [id number] DOB: Nov 08, 1960 Today's Date: 04/08/2015    History of Present Illness L TKR    PT Comments    Pt progressing steadily with mobility.  Follow Up Recommendations  SNF     Equipment Recommendations  Rolling walker with 5" wheels    Recommendations for Other Services OT consult     Precautions / Restrictions Precautions Precautions: Knee;Fall Precaution Comments: Reviewed knee precautions through interpreter Required Braces or Orthoses: Knee Immobilizer - Left Knee Immobilizer - Left: Discontinue once straight leg raise with < 10 degree lag Restrictions Weight Bearing Restrictions: No Other Position/Activity Restrictions: WBAT    Mobility  Bed Mobility               General bed mobility comments: OOB with OT  Transfers Overall transfer level: Needs assistance Equipment used: Rolling walker (2 wheeled) Transfers: Sit to/from Stand Sit to Stand: Min guard         General transfer comment: cues for UE/LE placement; multimodal cues utilized  Ambulation/Gait Ambulation/Gait assistance: Min assist;Min guard Ambulation Distance (Feet): 74 Feet Assistive device: Rolling walker (2 wheeled) Gait Pattern/deviations: Step-to pattern;Decreased step length - right;Decreased step length - left;Shuffle;Trunk flexed Gait velocity: decr   General Gait Details: min cues for sequence, posture and position from [employer]            Wheelchair Mobility    Modified Rankin (Stroke Patients Only)       Balance                                    Cognition Arousal/Alertness: Awake/alert Behavior During Therapy: WFL for tasks assessed/performed Overall Cognitive Status: Within Functional Limits for tasks assessed                      Exercises Total Joint Exercises Ankle Circles/Pumps: Both;20 reps;AAROM;AROM;Supine Quad Sets: Both;AROM;AAROM;10  reps;Supine Heel Slides: AAROM;Left;10 reps;Supine Straight Leg Raises: AAROM;Left;10 reps;Supine Goniometric ROM: AAROM at L knee -10-  70    General Comments        Pertinent Vitals/Pain Pain Assessment: Faces Pain Score: 5  Pain Location: L knee Pain Descriptors / Indicators: Sore Pain Intervention(s): Limited activity within patient's tolerance;Monitored during session;Premedicated before session;Ice applied    Home Living Family/patient expects to be discharged to:: Skilled nursing facility Living Arrangements: Non-relatives/Friends                  Prior Function Level of Independence: Independent          PT Goals (current goals can now be found in the care plan section) Acute Rehab PT Goals Patient Stated Goal: Short term SNF level rehab and then home with limited assist PT Goal Formulation: With patient Time For Goal Achievement: 04/12/15 Potential to Achieve Goals: Good Progress towards PT goals: Progressing toward goals    Frequency  7X/week    PT Plan Current plan remains appropriate    Co-evaluation             End of Session Equipment Utilized During Treatment: Gait belt;Left knee immobilizer Activity Tolerance: Patient tolerated treatment well Patient left: in chair;with call bell/phone within reach     Time: 0910-0934 PT Time Calculation (min) (ACUTE ONLY): 24 min  Charges:  $Gait Training: 8-22 mins $Therapeutic Exercise: 8-22 mins  G Codes:      Eric Collier Apr 29, 2015, 12:51 PM

## 2015-04-08 NOTE — Progress Notes (Signed)
Patient ID: Eric Collier, male   DOB: Sep 05, 1960, 55 y.o.   MRN: [id number]  Pt seen by myself and Dr. Shelle Iron this afternoon Moderate hematoma L knee Okay to ambulate but hold off on ROM with PT for now Ice, immobilizer, elevate D/C Xarelto Plan to start ASA  tomorrow afternoon for one dose then BID upon D/C To be evaluated by Dr. Linna Caprice tomorrow Plan D/C to SNF tomorrow

## 2015-04-08 NOTE — Progress Notes (Signed)
Patient ID: Eric Collier, male   DOB: 05/24/1960, 55 y.o.   MRN: [id number] Subjective: 1 Day Post-Op Procedure(s) (LRB): LEFT TOTAL KNEE ARTHROPLASTY (Left) Patient reports pain as mild.    Patient has complaints of mild L knee pain. Well controlled. No other c/o.  We will start therapy today. Plan is to go to SNF after hospital stay.  Objective: Vital signs in last 24 hours: Temp:  [97.2 F (36.2 C)-99 F (37.2 C)] 98.6 F (37 C) (02/17 0548) Pulse Rate:  [63-89] 67 (02/17 0548) Resp:  [10-20] 16 (02/17 0548) BP: (106-161)/(61-96) 112/70 mmHg (02/17 0548) SpO2:  [95 %-100 %] 97 % (02/17 0548)  Intake/Output from previous day:  Intake/Output Summary (Last 24 hours) at 04/08/15 0808 Last data filed at 04/08/15 0650  Gross per 24 hour  Intake 4389.17 ml  Output   5550 ml  Net -1160.83 ml    Intake/Output this shift:    Labs: Results for orders placed or performed during the hospital encounter of 04/07/15  Glucose, capillary  Result Value Ref Range   Glucose-Capillary 136 (H) 65 - 99 mg/dL   Comment 1 Notify RN   Glucose, capillary  Result Value Ref Range   Glucose-Capillary 195 (H) 65 - 99 mg/dL   Comment 1 Notify RN    Comment 2 Document in Chart   Glucose, capillary  Result Value Ref Range   Glucose-Capillary 206 (H) 65 - 99 mg/dL  Glucose, capillary  Result Value Ref Range   Glucose-Capillary 163 (H) 65 - 99 mg/dL  CBC  Result Value Ref Range   WBC 8.7 4.0 - 10.5 K/uL   RBC 4.19 (L) 4.22 - 5.81 MIL/uL   Hemoglobin 11.8 (L) 13.0 - 17.0 g/dL   HCT 40.9 (L) 81.1 - 91.4 %   MCV 84.7 78.0 - 100.0 fL   MCH 28.2 26.0 - 34.0 pg   MCHC 33.2 30.0 - 36.0 g/dL   RDW 78.2 95.6 - 21.3 %   Platelets 215 150 - 400 K/uL  Basic metabolic panel  Result Value Ref Range   Sodium 137 135 - 145 mmol/L   Potassium 3.9 3.5 - 5.1 mmol/L   Chloride 102 101 - 111 mmol/L   CO2 26 22 - 32 mmol/L   Glucose, Bld 141 (H) 65 - 99 mg/dL   BUN 14 6 - 20 mg/dL   Creatinine, Ser 0.86  0.61 - 1.24 mg/dL   Calcium 8.7 (L) 8.9 - 10.3 mg/dL   GFR calc non Af Amer >60 >60 mL/min   GFR calc Af Amer >60 >60 mL/min   Anion gap 9 5 - 15  Glucose, capillary  Result Value Ref Range   Glucose-Capillary 145 (H) 65 - 99 mg/dL  Glucose, capillary  Result Value Ref Range   Glucose-Capillary 121 (H) 65 - 99 mg/dL   Comment 1 Notify RN     Exam - Neurologically intact ABD soft Neurovascular intact Sensation intact distally Intact pulses distally Dorsiflexion/Plantar flexion intact Incision: dressing C/D/I and scant drainage No cellulitis present Compartment soft no sign of DVT Dressing - minimal bloody drainage on dressing Motor function intact - moving foot and toes well on exam.  No change in swelling L knee from yesterday's exam.  Assessment/Plan: 1 Day Post-Op Procedure(s) (LRB): LEFT TOTAL KNEE ARTHROPLASTY (Left)  Advance diet Up with therapy D/C IV fluids Past Medical History  Diagnosis Date  . Hypertension   . Diabetes mellitus without complication (HCC)     oral meds only  .  Language barrier     Speaks "[place] language"- will need interpreter    DVT Prophylaxis - Xarelto Protocol Weight-Bearing as tolerated to Left leg No vaccines. Plan D/C to SNF when ready Will discuss with Dr. Elissa Lovett, Dayna Barker. 04/08/2015, 8:08 AM

## 2015-04-09 LAB — CBC
HCT: 38 % — ABNORMAL LOW (ref 39.0–52.0)
Hemoglobin: 12 g/dL — ABNORMAL LOW (ref 13.0–17.0)
MCH: 27.5 pg (ref 26.0–34.0)
MCHC: 31.6 g/dL (ref 30.0–36.0)
MCV: 87 fL (ref 78.0–100.0)
PLATELETS: 269 10*3/uL (ref 150–400)
RBC: 4.37 MIL/uL (ref 4.22–5.81)
RDW: 13.6 % (ref 11.5–15.5)
WBC: 7.4 10*3/uL (ref 4.0–10.5)

## 2015-04-09 LAB — GLUCOSE, CAPILLARY
GLUCOSE-CAPILLARY: 266 mg/dL — AB (ref 65–99)
Glucose-Capillary: 201 mg/dL — ABNORMAL HIGH (ref 65–99)

## 2015-04-09 NOTE — Progress Notes (Signed)
Discharged from floor via w/c, belongings & family with pt. No changes in assessment. Jak Haggar  

## 2015-04-09 NOTE — Progress Notes (Signed)
Utilization review completed.  

## 2015-04-09 NOTE — Plan of Care (Signed)
Problem: Education: Goal: Understanding of discharge needs will improve Outcome: Completed/Met Date Met:  04/09/15 For rehab at [facility]

## 2015-04-09 NOTE — Clinical Social Work Placement (Signed)
Patient is set to discharge to [facility] today. Patient & brother, Myna Hidalgo (ph#: [phone]) aware.  Brother to transport to SNF.     Lincoln Maxin, LCSW [facility] Clinical Social Worker cell #: [phone]    CLINICAL SOCIAL WORK PLACEMENT  NOTE  Date:  04/09/2015  Patient Details  Name: Eric Collier MRN: [id number] Date of Birth: Apr 14, 1960  Clinical Social Work is seeking post-discharge placement for this patient at the Skilled  Nursing Facility level of care (*CSW will initial, date and re-position this form in  chart as items are completed):  Yes   Patient/family provided with [facility] Clinical Social Work Department's list of facilities offering this level of care within the geographic area requested by the patient (or if unable, by the patient's family).  Yes   Patient/family informed of their freedom to choose among providers that offer the needed level of care, that participate in Medicare, Medicaid or managed care program needed by the patient, have an available bed and are willing to accept the patient.  Yes   Patient/family informed of 's ownership interest in [facility] and [facility], as well as of the fact that they are under no obligation to receive care at these facilities.  PASRR submitted to EDS on 04/08/15     PASRR number received on 04/08/15     Existing PASRR number confirmed on       FL2 transmitted to all facilities in geographic area requested by pt/family on 04/08/15     FL2 transmitted to all facilities within larger geographic area on       Patient informed that his/her managed care company has contracts with or will negotiate with certain facilities, including the following:        Yes   Patient/family informed of bed offers received.  Patient chooses bed at [facility]     Physician recommends and patient chooses bed at      Patient to be transferred to [facility] on 04/09/15.  Patient to be transferred to facility by brother's car     Patient family notified on 04/09/15 of transfer.  Name of family member notified:  patient's brother, Myna Hidalgo     PHYSICIAN       Additional Comment:    _______________________________________________ Arlyss Repress, LCSW 04/09/2015, 9:53 AM

## 2015-04-09 NOTE — Progress Notes (Signed)
   Subjective:  Patient reports pain as mild to moderate.  No c/o. Tmax 101.9 - respond to tylenol and IS.  Objective:   VITALS:   Filed Vitals:   04/08/15 2244 04/09/15 0030 04/09/15 0208 04/09/15 0616  BP: 163/89  141/73 133/69  Pulse: 69  64 72  Temp: 101.9 F (38.8 C) 99 F (37.2 C) 100.2 F (37.9 C) 100.2 F (37.9 C)  TempSrc: Oral Oral Oral Oral  Resp: Height:      Weight:      SpO2: 98%  96% 100%    ABD soft Sensation intact distally Intact pulses distally Dorsiflexion/Plantar flexion intact Incision: dressing C/D/I and scant drainage Compartment soft mild/mod swelling   Lab Results  Component Value Date   WBC 7.4 04/09/2015   HGB 12.0* 04/09/2015   HCT 38.0* 04/09/2015   MCV 87.0 04/09/2015   PLT 269 04/09/2015   BMET    Component Value Date/Time   NA 137 04/08/2015 0405   K 3.9 04/08/2015 0405   CL 102 04/08/2015 0405   CO2 26 04/08/2015 0405   GLUCOSE 141* 04/08/2015 0405   BUN 14 04/08/2015 0405   CREATININE 1.03 04/08/2015 0405   CALCIUM 8.7* 04/08/2015 0405   GFRNONAA >60 04/08/2015 0405   GFRAA >60 04/08/2015 0405     Assessment/Plan: 2 Days Post-Op   Active Problems:   Left knee DJD   WBAT with walker Knee immobilizer at all times ASA for DVT ppx Fever: due to atelectasis, continue IS D/C to SNF F/u with Dr. Shelle Iron next week   Eric Collier, Eric Collier 04/09/2015, 8:17 AM   [name], MD Cell [phone]

## 2016-03-20 IMAGING — DX DG KNEE 1-2V PORT*L*
2 series · 2 of 2 positions shown · non-contrast
Comparison: 03/31/2015

ADDENDUM:
Additional frontal and lateral view of the left knee performed at
[DATE] a.m.. Again noted left knee prosthesis with anatomic
alignment. There is soft tissue swelling suprapatellar region. There
is interval moderate joint effusion with air-fluid level. Anterior
displacement of patella. Hemarthrosis cannot be excluded.

These results were called by telephone at the time of interpretation
on 04/07/2015 at [DATE] to Dr. SANGWOK JEUN , who verbally
acknowledged these results.
CLINICAL DATA: Status post left total knee replacement
EXAM:
PORTABLE LEFT KNEE - 1-2 VIEW

[knee ap]
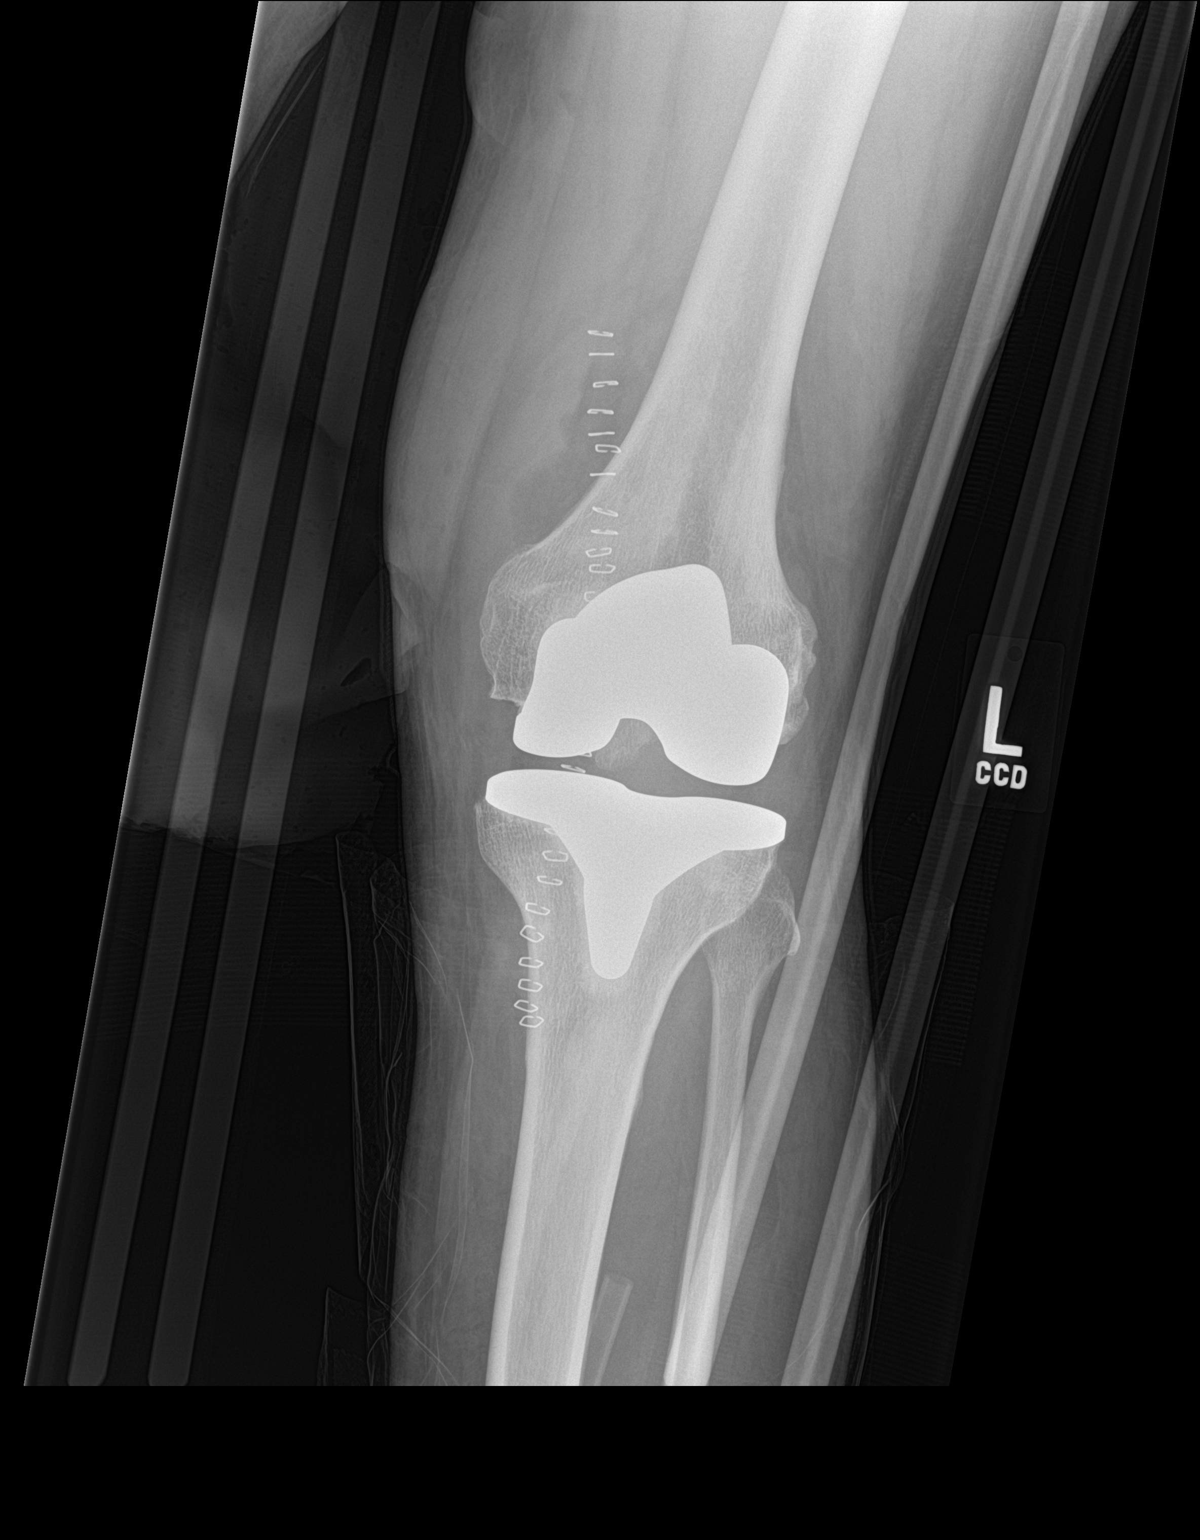

[knee lat]
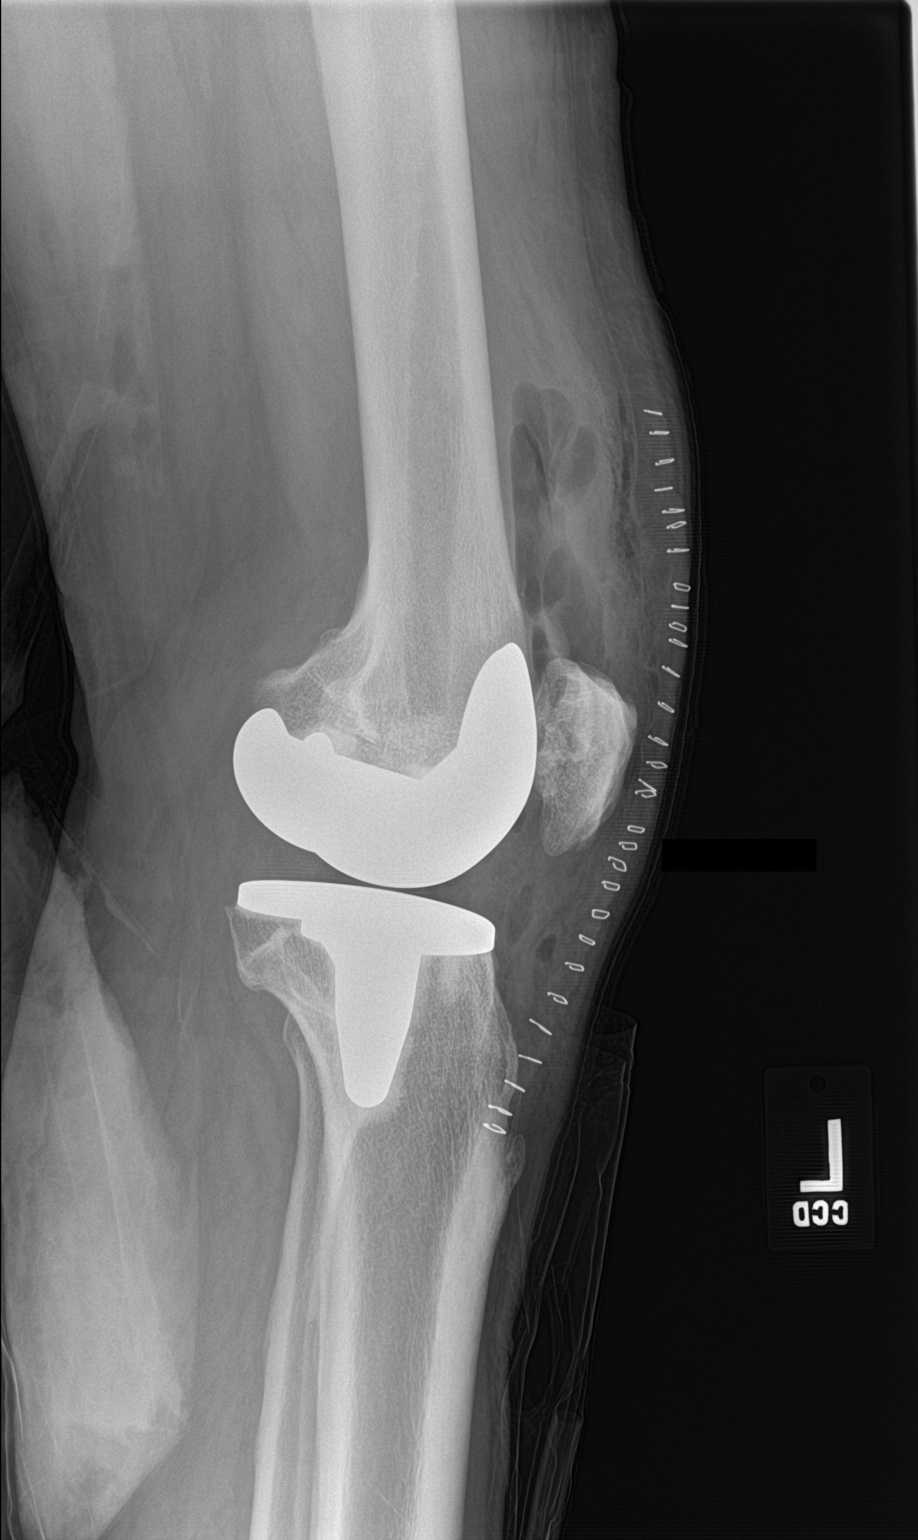

[2 of 2 positions shown; findings below may reference images not displayed]

FINDINGS: Two portable views of the left knee submitted. There is left knee
prosthesis with anatomic alignment. Postsurgical changes are noted
with midline anterior skin staples. Small amount of periarticular
soft tissue air.
IMPRESSION: Left knee prosthesis with anatomic alignment. Postsurgical changes
are noted.
